# Patient Record
Sex: Male | Born: 1970
Health system: Southern US, Community
[De-identification: ages and names within clinical notes are randomized; demographics above are authoritative.]

## PROBLEM LIST (undated history)

## (undated) DIAGNOSIS — E669 Obesity, unspecified: Secondary | ICD-10-CM

## (undated) DIAGNOSIS — G4733 Obstructive sleep apnea (adult) (pediatric): Secondary | ICD-10-CM

## (undated) DIAGNOSIS — I10 Essential (primary) hypertension: Secondary | ICD-10-CM

## (undated) DIAGNOSIS — E119 Type 2 diabetes mellitus without complications: Secondary | ICD-10-CM

## (undated) DIAGNOSIS — E785 Hyperlipidemia, unspecified: Secondary | ICD-10-CM

---

## 2018-04-29 ENCOUNTER — Encounter (HOSPITAL_COMMUNITY): Payer: Self-pay

## 2018-04-29 ENCOUNTER — Emergency Department (HOSPITAL_COMMUNITY)
Admission: EM | Admit: 2018-04-29 | Discharge: 2018-04-29 | Disposition: A | Discharge status: Home / Self Care | Payer: Self-pay | Attending: Emergency Medicine | Admitting: Emergency Medicine

## 2018-04-29 ENCOUNTER — Other Ambulatory Visit: Payer: Self-pay

## 2018-04-29 DIAGNOSIS — R29898 Other symptoms and signs involving the musculoskeletal system: Secondary | ICD-10-CM

## 2018-04-29 DIAGNOSIS — R531 Weakness: Secondary | ICD-10-CM | POA: Insufficient documentation

## 2018-04-29 HISTORY — DX: Type 2 diabetes mellitus without complications: E11.9

## 2018-04-29 HISTORY — DX: Essential (primary) hypertension: I10

## 2018-04-29 LAB — CBC
HEMATOCRIT: 39.9 % (ref 39.0–52.0)
HEMOGLOBIN: 13.6 g/dL (ref 13.0–17.0)
MCH: 29.1 pg (ref 26.0–34.0)
MCHC: 34.1 g/dL (ref 30.0–36.0)
MCV: 85.4 fL (ref 78.0–100.0)
Platelets: 315 10*3/uL (ref 150–400)
RBC: 4.67 MIL/uL (ref 4.22–5.81)
RDW: 12.9 % (ref 11.5–15.5)
WBC: 5.9 10*3/uL (ref 4.0–10.5)

## 2018-04-29 LAB — COMPREHENSIVE METABOLIC PANEL
ALT: 14 U/L — AB (ref 17–63)
AST: 18 U/L (ref 15–41)
Albumin: 4.4 g/dL (ref 3.5–5.0)
Alkaline Phosphatase: 80 U/L (ref 38–126)
Anion gap: 8 (ref 5–15)
BILIRUBIN TOTAL: 0.8 mg/dL (ref 0.3–1.2)
BUN: 20 mg/dL (ref 6–20)
CALCIUM: 9.2 mg/dL (ref 8.9–10.3)
CO2: 23 mmol/L (ref 22–32)
Chloride: 106 mmol/L (ref 101–111)
Creatinine, Ser: 1.39 mg/dL — ABNORMAL HIGH (ref 0.61–1.24)
GFR calc Af Amer: >60 mL/min (ref >60)
GFR calc non Af Amer: 59 mL/min — ABNORMAL LOW (ref >60)
GLUCOSE: 106 mg/dL — AB (ref 65–99)
POTASSIUM: 3.7 mmol/L (ref 3.5–5.1)
Sodium: 137 mmol/L (ref 135–145)
Total Protein: 8.1 g/dL (ref 6.5–8.1)

## 2018-04-29 LAB — ABO/RH: ABO/RH(D): O POS

## 2018-04-29 LAB — CBG MONITORING, ED: Glucose-Capillary: 106 mg/dL — ABNORMAL HIGH (ref 65–99)

## 2018-04-29 LAB — TYPE AND SCREEN
ABO/RH(D): O POS
Antibody Screen: NEGATIVE

## 2018-04-29 NOTE — ED Notes (Signed)
No reply for VS at 15:26.

## 2018-04-29 NOTE — ED Notes (Signed)
MD at bedside giving discharge instructions.  Pt verbalized understanding.

## 2018-04-29 NOTE — ED Triage Notes (Signed)
Pt presents for evaluation of dizziness that started while he was in class today. Pt reports he has had some diarrhea and intermittent rectal bleeding since yesterday. Pt reports he is a diabetic, non insulin dependent and has not checked blood glucose but states vision disturbances intermittently.

## 2018-04-29 NOTE — ED Provider Notes (Signed)
[facility] EMERGENCY DEPARTMENT Provider Note   CSN: [id number] Arrival date & time: 04/29/18  1223     History   Chief Complaint Chief Complaint  Patient presents with  . Dizziness  . Rectal Bleeding    HPI Oscar Glenn is a 47 y.o. male.  47 yo M with a chief complaint of left leg weakness.  Patient is also had blood in his stool for 3 or 4 days that resolved about 2 or 3 days ago.  He denies back pain denies loss of bowel or bladder.  Denies neck pain.  Has had what he describes as a sinus headache.  Pain to the frontal sinus bilaterally.  Denies fevers or chills.  Denies any limitation to his back.  Feels that he has been walking with a limp due to the weakness.  Has never had this issue before.  Denies pain to the leg.  Denies trauma.  He is having some trouble controlling his blood sugars at home earlier this week as well.  The history is provided by the patient.  Dizziness  Associated symptoms: blood in stool and weakness (left leg)   Associated symptoms: no chest pain, no diarrhea, no headaches, no palpitations, no shortness of breath and no vomiting   Rectal Bleeding  Associated symptoms: dizziness   Associated symptoms: no abdominal pain, no fever and no vomiting   Illness  This is a new problem. The current episode started more than 1 week ago. The problem occurs constantly. The problem has not changed since onset.Pertinent negatives include no chest pain, no abdominal pain, no headaches and no shortness of breath. The symptoms are aggravated by walking. Nothing relieves the symptoms. He has tried nothing for the symptoms. The treatment provided no relief.    Past Medical History:  Diagnosis Date  . Diabetes mellitus without complication (HCC)   . Hypertension     There are no active problems to display for this patient.   History reviewed. No pertinent surgical history.      Home Medications    Prior to Admission medications   Not on  File    Family History No family history on file.  Social History Social History   Tobacco Use  . Smoking status: Never Smoker  . Smokeless tobacco: Never Used  Substance Use Topics  . Alcohol use: Not Currently  . Drug use: Not Currently     Allergies   Patient has no known allergies.   Review of Systems Review of Systems  Constitutional: Negative for chills and fever.  HENT: Negative for congestion and facial swelling.   Eyes: Negative for discharge and visual disturbance.  Respiratory: Negative for shortness of breath.   Cardiovascular: Negative for chest pain and palpitations.  Gastrointestinal: Positive for blood in stool and hematochezia. Negative for abdominal pain, diarrhea and vomiting.  Musculoskeletal: Positive for gait problem. Negative for arthralgias and myalgias.  Skin: Negative for color change and rash.  Neurological: Positive for dizziness and weakness (left leg). Negative for tremors, syncope and headaches.  Psychiatric/Behavioral: Negative for confusion and dysphoric mood.     Physical Exam Updated Vital Signs BP (!) 174/107   Pulse (!) 56   Temp 98.2 F (36.8 C) (Oral)   Resp 18   SpO2 100%   Physical Exam  Constitutional: He is oriented to person, place, and time. He appears well-developed and well-nourished.  HENT:  Head: Normocephalic and atraumatic.  Eyes: Pupils are equal, round, and reactive to light. EOM  are normal.  Neck: Normal range of motion. Neck supple. No JVD present.  Cardiovascular: Normal rate and regular rhythm. Exam reveals no gallop and no friction rub.  No murmur heard. Pulmonary/Chest: No respiratory distress. He has no wheezes.  Abdominal: He exhibits no distension. There is no rebound and no guarding.  Musculoskeletal: Normal range of motion.  Neurological: He is alert and oriented to person, place, and time. No cranial nerve deficit or sensory deficit. Coordination and gait normal. GCS eye subscore is 4. GCS verbal  subscore is 5. GCS motor subscore is 6. He displays no Babinski's sign on the right side. He displays no Babinski's sign on the left side.  Reflex Scores:      Tricep reflexes are 2+ on the right side and 2+ on the left side.      Bicep reflexes are 2+ on the right side and 2+ on the left side.      Brachioradialis reflexes are 2+ on the right side and 2+ on the left side.      Patellar reflexes are 2+ on the right side and 2+ on the left side.      Achilles reflexes are 2+ on the right side and 2+ on the left side. Left plantar flexion 4 out of 5 compared to right.  He is able to stand on his tiptoes.  He walks with no issue but he feels that his leg is lagging behind  Skin: No rash noted. No pallor.  Psychiatric: He has a normal mood and affect. His behavior is normal.  Nursing note and vitals reviewed.    ED Treatments / Results  Labs (all labs ordered are listed, but only abnormal results are displayed) Labs Reviewed  COMPREHENSIVE METABOLIC PANEL - Abnormal; Notable for the following components:      Result Value   Glucose, Bld 106 (*)    Creatinine, Ser 1.39 (*)    ALT 14 (*)    GFR calc non Af Amer 59 (*)    All other components within normal limits  CBG MONITORING, ED - Abnormal; Notable for the following components:   Glucose-Capillary 106 (*)    All other components within normal limits  CBC  POC OCCULT BLOOD, ED  TYPE AND SCREEN  ABO/RH    EKG EKG Interpretation  Date/Time:  Tuesday April 29 2018 12:30:02 EDT Ventricular Rate:  65 PR Interval:  170 QRS Duration: 76 QT Interval:  398 QTC Calculation: 413 R Axis:   21 Text Interpretation:  Normal sinus rhythm Nonspecific T wave abnormality Abnormal ECG No old tracing to compare Confirmed by Melene Plan [phone]) on 04/29/2018 5:10:19 PM   Radiology No results found.  Procedures Procedures (including critical care time)  Medications Ordered in ED Medications - No data to display   Initial Impression /  Assessment and Plan / ED Course  I have reviewed the triage vital signs and the nursing notes.  Pertinent labs & imaging results that were available during my care of the patient were reviewed by me and considered in my medical decision making (see chart for details).     47 yo M with a chief complaint of rectal bleeding that has resolved and transient dizziness and leg weakness.  He feels that the leg has been weak for about a week now.  On my exam the patient does have plantar flexion weakness compared to the other side.  Will discuss with neurology. Discussed with Dr. Amada Jupiter, with focal localized neuro finding, and  able to walk on his toes, did not feel that patient needed emergent MRI.  Recommended outpatient evaluation.    9:30 PM:  I have discussed the diagnosis/risks/treatment options with the patient and believe the pt to be eligible for discharge home to follow-up with PCP, neuro. We also discussed returning to the ED immediately if new or worsening sx occur. We discussed the sx which are most concerning (e.g., sudden worsening pain, fever, inability to tolerate by mouth) that necessitate immediate return. Medications administered to the patient during their visit and any new prescriptions provided to the patient are listed below.  Medications given during this visit Medications - No data to display  Labs reviewed cbc with hgb at baseline, cmp with mild worsening of creatinine   The patient appears reasonably screen and/or stabilized for discharge and I doubt any other medical condition or other [facility] requiring further screening, evaluation, or treatment in the ED at this time prior to discharge.    Final Clinical Impressions(s) / ED Diagnoses   Final diagnoses:  Weakness of left foot    ED Discharge Orders        Ordered    Ambulatory referral to Neurology    Comments:  Weakness with plantar flexion   04/29/18 1815       Melene Plan, DO 04/29/18 2130

## 2018-04-29 NOTE — Discharge Instructions (Signed)
Follow up with a neurologist.  Return for worsening symptoms

## 2018-04-30 ENCOUNTER — Encounter: Payer: Self-pay | Admitting: Neurology

## 2018-06-10 ENCOUNTER — Other Ambulatory Visit: Payer: Self-pay

## 2018-06-10 ENCOUNTER — Inpatient Hospital Stay (HOSPITAL_COMMUNITY): Payer: Self-pay

## 2018-06-10 ENCOUNTER — Emergency Department (HOSPITAL_COMMUNITY): Payer: Self-pay

## 2018-06-10 ENCOUNTER — Inpatient Hospital Stay (HOSPITAL_COMMUNITY)
Admission: EM | Admit: 2018-06-10 | Discharge: 2018-06-12 | DRG: 065 | Disposition: A | Discharge status: Home / Self Care | Payer: Self-pay | Attending: Family Medicine | Admitting: Family Medicine

## 2018-06-10 ENCOUNTER — Encounter (HOSPITAL_COMMUNITY): Payer: Self-pay

## 2018-06-10 DIAGNOSIS — Z590 Homelessness: Secondary | ICD-10-CM

## 2018-06-10 DIAGNOSIS — G4733 Obstructive sleep apnea (adult) (pediatric): Secondary | ICD-10-CM | POA: Diagnosis present

## 2018-06-10 DIAGNOSIS — I6523 Occlusion and stenosis of bilateral carotid arteries: Secondary | ICD-10-CM | POA: Diagnosis present

## 2018-06-10 DIAGNOSIS — Z87898 Personal history of other specified conditions: Secondary | ICD-10-CM | POA: Insufficient documentation

## 2018-06-10 DIAGNOSIS — G8194 Hemiplegia, unspecified affecting left nondominant side: Secondary | ICD-10-CM | POA: Diagnosis present

## 2018-06-10 DIAGNOSIS — I6609 Occlusion and stenosis of unspecified middle cerebral artery: Secondary | ICD-10-CM | POA: Diagnosis present

## 2018-06-10 DIAGNOSIS — R4701 Aphasia: Secondary | ICD-10-CM | POA: Diagnosis present

## 2018-06-10 DIAGNOSIS — I6623 Occlusion and stenosis of bilateral posterior cerebral arteries: Secondary | ICD-10-CM | POA: Diagnosis present

## 2018-06-10 DIAGNOSIS — Z9114 Patient's other noncompliance with medication regimen: Secondary | ICD-10-CM

## 2018-06-10 DIAGNOSIS — F329 Major depressive disorder, single episode, unspecified: Secondary | ICD-10-CM | POA: Diagnosis present

## 2018-06-10 DIAGNOSIS — I639 Cerebral infarction, unspecified: Secondary | ICD-10-CM

## 2018-06-10 DIAGNOSIS — R29702 NIHSS score 2: Secondary | ICD-10-CM | POA: Diagnosis present

## 2018-06-10 DIAGNOSIS — E876 Hypokalemia: Secondary | ICD-10-CM | POA: Diagnosis present

## 2018-06-10 DIAGNOSIS — E669 Obesity, unspecified: Secondary | ICD-10-CM | POA: Diagnosis present

## 2018-06-10 DIAGNOSIS — I6619 Occlusion and stenosis of unspecified anterior cerebral artery: Secondary | ICD-10-CM | POA: Diagnosis present

## 2018-06-10 DIAGNOSIS — I10 Essential (primary) hypertension: Secondary | ICD-10-CM | POA: Diagnosis present

## 2018-06-10 DIAGNOSIS — I160 Hypertensive urgency: Secondary | ICD-10-CM | POA: Diagnosis present

## 2018-06-10 DIAGNOSIS — Z609 Problem related to social environment, unspecified: Secondary | ICD-10-CM | POA: Diagnosis present

## 2018-06-10 DIAGNOSIS — I6381 Other cerebral infarction due to occlusion or stenosis of small artery: Principal | ICD-10-CM | POA: Diagnosis present

## 2018-06-10 DIAGNOSIS — E785 Hyperlipidemia, unspecified: Secondary | ICD-10-CM | POA: Diagnosis present

## 2018-06-10 DIAGNOSIS — R7303 Prediabetes: Secondary | ICD-10-CM

## 2018-06-10 DIAGNOSIS — R4781 Slurred speech: Secondary | ICD-10-CM | POA: Diagnosis present

## 2018-06-10 DIAGNOSIS — R471 Dysarthria and anarthria: Secondary | ICD-10-CM | POA: Diagnosis present

## 2018-06-10 DIAGNOSIS — F419 Anxiety disorder, unspecified: Secondary | ICD-10-CM | POA: Diagnosis present

## 2018-06-10 DIAGNOSIS — R402413 Glasgow coma scale score 13-15, at hospital admission: Secondary | ICD-10-CM | POA: Diagnosis present

## 2018-06-10 DIAGNOSIS — H538 Other visual disturbances: Secondary | ICD-10-CM | POA: Diagnosis present

## 2018-06-10 DIAGNOSIS — Z6832 Body mass index (BMI) 32.0-32.9, adult: Secondary | ICD-10-CM

## 2018-06-10 DIAGNOSIS — Z888 Allergy status to other drugs, medicaments and biological substances status: Secondary | ICD-10-CM

## 2018-06-10 HISTORY — DX: Obesity, unspecified: E66.9

## 2018-06-10 HISTORY — DX: Hyperlipidemia, unspecified: E78.5

## 2018-06-10 HISTORY — DX: Obstructive sleep apnea (adult) (pediatric): G47.33

## 2018-06-10 LAB — DIFFERENTIAL
Abs Immature Granulocytes: 0 10*3/uL (ref 0.0–0.1)
BASOS ABS: 0 10*3/uL (ref 0.0–0.1)
BASOS PCT: 0 %
EOS ABS: 0.1 10*3/uL (ref 0.0–0.7)
EOS PCT: 1 %
IMMATURE GRANULOCYTES: 0 %
Lymphocytes Relative: 32 %
Lymphs Abs: 2.2 10*3/uL (ref 0.7–4.0)
Monocytes Absolute: 0.6 10*3/uL (ref 0.1–1.0)
Monocytes Relative: 8 %
NEUTROS PCT: 59 %
Neutro Abs: 4 10*3/uL (ref 1.7–7.7)

## 2018-06-10 LAB — COMPREHENSIVE METABOLIC PANEL
ALBUMIN: 4.1 g/dL (ref 3.5–5.0)
ALT: 16 U/L — ABNORMAL LOW (ref 17–63)
ANION GAP: 10 (ref 5–15)
AST: 22 U/L (ref 15–41)
Alkaline Phosphatase: 63 U/L (ref 38–126)
BUN: 15 mg/dL (ref 6–20)
CO2: 24 mmol/L (ref 22–32)
Calcium: 9.1 mg/dL (ref 8.9–10.3)
Chloride: 105 mmol/L (ref 101–111)
Creatinine, Ser: 1.34 mg/dL — ABNORMAL HIGH (ref 0.61–1.24)
GFR calc Af Amer: >60 mL/min (ref >60)
GFR calc non Af Amer: >60 mL/min (ref >60)
GLUCOSE: 94 mg/dL (ref 65–99)
POTASSIUM: 3.9 mmol/L (ref 3.5–5.1)
SODIUM: 139 mmol/L (ref 135–145)
Total Bilirubin: 0.8 mg/dL (ref 0.3–1.2)
Total Protein: 7.2 g/dL (ref 6.5–8.1)

## 2018-06-10 LAB — CBC
HEMATOCRIT: 38.9 % — AB (ref 39.0–52.0)
Hemoglobin: 13.1 g/dL (ref 13.0–17.0)
MCH: 29.2 pg (ref 26.0–34.0)
MCHC: 33.7 g/dL (ref 30.0–36.0)
MCV: 86.6 fL (ref 78.0–100.0)
PLATELETS: 287 10*3/uL (ref 150–400)
RBC: 4.49 MIL/uL (ref 4.22–5.81)
RDW: 13 % (ref 11.5–15.5)
WBC: 6.8 10*3/uL (ref 4.0–10.5)

## 2018-06-10 LAB — CBG MONITORING, ED: GLUCOSE-CAPILLARY: 91 mg/dL (ref 65–99)

## 2018-06-10 LAB — I-STAT CHEM 8, ED
BUN: 17 mg/dL (ref 6–20)
CHLORIDE: 104 mmol/L (ref 101–111)
Calcium, Ion: 1.16 mmol/L (ref 1.15–1.40)
Creatinine, Ser: 1.2 mg/dL (ref 0.61–1.24)
Glucose, Bld: 95 mg/dL (ref 65–99)
HEMATOCRIT: 40 % (ref 39.0–52.0)
Hemoglobin: 13.6 g/dL (ref 13.0–17.0)
Potassium: 3.9 mmol/L (ref 3.5–5.1)
SODIUM: 139 mmol/L (ref 135–145)
TCO2: 24 mmol/L (ref 22–32)

## 2018-06-10 LAB — PROTIME-INR
INR: 0.98
Prothrombin Time: 12.9 seconds (ref 11.4–15.2)

## 2018-06-10 LAB — URINALYSIS, ROUTINE W REFLEX MICROSCOPIC
BILIRUBIN URINE: NEGATIVE
Glucose, UA: NEGATIVE mg/dL
Hgb urine dipstick: NEGATIVE
Ketones, ur: NEGATIVE mg/dL
Leukocytes, UA: NEGATIVE
NITRITE: NEGATIVE
PROTEIN: NEGATIVE mg/dL
Specific Gravity, Urine: 1.01 (ref 1.005–1.030)
pH: 7 (ref 5.0–8.0)

## 2018-06-10 LAB — I-STAT TROPONIN, ED: Troponin i, poc: 0.01 ng/mL (ref 0.00–0.08)

## 2018-06-10 LAB — APTT: APTT: 29 s (ref 24–36)

## 2018-06-10 LAB — ETHANOL

## 2018-06-10 MED ORDER — ACETAMINOPHEN 325 MG PO TABS: 650.0000 mg | ORAL_TABLET | ORAL | Status: DC | PRN

## 2018-06-10 MED ORDER — ENOXAPARIN SODIUM 40 MG/0.4ML ~~LOC~~ SOLN
40.0000 mg | SUBCUTANEOUS | Status: DC
  Administered 2018-06-11 (×2): 40 mg via SUBCUTANEOUS
  Filled 2018-06-10 (×2): qty 0.4

## 2018-06-10 MED ORDER — ROSUVASTATIN CALCIUM 20 MG PO TABS
20.0000 mg | ORAL_TABLET | Freq: Every day | ORAL | Status: DC
  Administered 2018-06-11: 20 mg via ORAL
  Filled 2018-06-10: qty 1

## 2018-06-10 MED ORDER — LISINOPRIL 20 MG PO TABS
20.0000 mg | ORAL_TABLET | Freq: Every day | ORAL | Status: DC
  Administered 2018-06-11: 20 mg via ORAL
  Filled 2018-06-10 (×2): qty 1

## 2018-06-10 MED ORDER — ENSURE ENLIVE PO LIQD
237.0000 mL | Freq: Two times a day (BID) | ORAL | Status: DC
  Administered 2018-06-11 – 2018-06-12 (×4): 237 mL via ORAL

## 2018-06-10 MED ORDER — HYDRALAZINE HCL 20 MG/ML IJ SOLN: 5.0000 mg | INTRAMUSCULAR | Status: DC | PRN

## 2018-06-10 MED ORDER — ACETAMINOPHEN 650 MG RE SUPP: 650.0000 mg | RECTAL | Status: DC | PRN

## 2018-06-10 MED ORDER — LABETALOL HCL 5 MG/ML IV SOLN
20.0000 mg | INTRAVENOUS | Status: DC | PRN
  Administered 2018-06-10: 20 mg via INTRAVENOUS
  Filled 2018-06-10: qty 4

## 2018-06-10 MED ORDER — ASPIRIN 81 MG PO CHEW
81.0000 mg | CHEWABLE_TABLET | Freq: Every day | ORAL | Status: DC
  Administered 2018-06-11 (×2): 81 mg via ORAL
  Filled 2018-06-10 (×2): qty 1

## 2018-06-10 MED ORDER — ACETAMINOPHEN 160 MG/5ML PO SOLN: 650.0000 mg | ORAL | Status: DC | PRN

## 2018-06-10 MED ORDER — SENNOSIDES-DOCUSATE SODIUM 8.6-50 MG PO TABS: 1.0000 | ORAL_TABLET | Freq: Every evening | ORAL | Status: DC | PRN

## 2018-06-10 MED ORDER — STROKE: EARLY STAGES OF RECOVERY BOOK
Freq: Once | Status: AC
  Administered 2018-06-11

## 2018-06-10 MED ORDER — CLOPIDOGREL BISULFATE 75 MG PO TABS
75.0000 mg | ORAL_TABLET | Freq: Every day | ORAL | Status: DC
  Administered 2018-06-11 – 2018-06-12 (×3): 75 mg via ORAL
  Filled 2018-06-10 (×3): qty 1

## 2018-06-10 NOTE — ED Triage Notes (Addendum)
Pt endorses slurred speech, blurred vision, jittery, dizziness that has been intermittent over the last 2 months. Pt endorses that he has had difficulty with his left side for 3 months. "I have been walking with a limp and dragging my left foot"  Pt noted to still have some slurring of speech and some blurred vison. Hypertensive, hx of same and not taking meds. Also not taking DM meds. No drift, facial droop or weakness noted at this time.

## 2018-06-10 NOTE — ED Provider Notes (Signed)
[facility] Provider [facility]   CSN: [id number] Arrival date & time: 06/10/18  1124     History   Chief Complaint Chief Complaint  Patient presents with  . Blurred Vision  . Headache  . Aphasia    HPI Oscar Glenn is a 47 y.o. male.  HPI   He presents for evaluation of dragging his left leg for 2 months, associated with occasional tingling and headache.  Today he noticed tingling again in his left arm, and a headache, so decided to come here.  Yesterday he was doing fairly well.  He states he last took hydrochlorothiazide for hypertension, about 2 months ago.  [facility] the shelter.  He denies fever, chills, nausea, vomiting, cough, shortness of breath, abdominal or back pain.  He denies use of tobacco, alcohol or drugs.  There are no other no modifying factors.  Past Medical History:  Diagnosis Date  . Diabetes mellitus without complication (HCC)   . HLD (hyperlipidemia)   . Hypertension   . Obesity   . OSA (obstructive sleep apnea)     Patient Active Problem List   Diagnosis Date Noted  . Stroke (HCC) 06/10/2018  . History of prediabetes   . Essential hypertension   . Hyperlipidemia   . OSA (obstructive sleep apnea)     History reviewed. No pertinent surgical history.      Home Medications    Prior to Admission medications   Not on File    Family History Family History  Adopted: Yes  Family history unknown: Yes    Social History Social History   Tobacco Use  . Smoking status: Never Smoker  . Smokeless tobacco: Never Used  Substance Use Topics  . Alcohol use: Not Currently  . Drug use: Not Currently     Allergies   Patient has no known allergies.   Review of Systems Review of Systems  All other systems reviewed and are negative.    Physical Exam Updated Vital Signs BP (!) 147/86 (BP Location: Left Arm)   Pulse 61   Temp 98.5 F (36.9 C) (Oral)   Resp 16   Ht 5\' 8"  (1.727 m)    Wt 97.5 kg (215 lb)   SpO2 98%   BMI 32.69 kg/m   Physical Exam  Constitutional: He is oriented to person, place, and time. He appears well-developed and well-nourished.  HENT:  Head: Normocephalic and atraumatic.  Right Ear: External ear normal.  Left Ear: External ear normal.  Eyes: Pupils are equal, round, and reactive to light. Conjunctivae and EOM are normal.  Neck: Normal range of motion and phonation normal. Neck supple.  Cardiovascular: Normal rate, regular rhythm and normal heart sounds.  Pulmonary/Chest: Effort normal and breath sounds normal. He exhibits no bony tenderness.  Abdominal: Soft. There is no tenderness.  Musculoskeletal: Normal range of motion.  Neurological: He is alert and oriented to person, place, and time. No cranial nerve deficit. He exhibits normal muscle tone. Coordination normal.  No dysarthria, aphasia or nystagmus.  He reports decreased light touch sensation in the left hand as compared to the right.  Skin: Skin is warm, dry and intact.  Psychiatric: He has a normal mood and affect. His behavior is normal. Judgment and thought content normal.  Nursing note and vitals reviewed.    ED Treatments / Results  Labs (all labs ordered are listed, but only abnormal results are displayed) Labs Reviewed  CBC - Abnormal; Notable for the  following components:      Result Value   HCT 38.9 (*)    All other components within normal limits  COMPREHENSIVE METABOLIC PANEL - Abnormal; Notable for the following components:   Creatinine, Ser 1.34 (*)    ALT 16 (*)    All other components within normal limits  URINALYSIS, ROUTINE W REFLEX MICROSCOPIC - Abnormal; Notable for the following components:   Color, Urine STRAW (*)    All other components within normal limits  LIPID PANEL - Abnormal; Notable for the following components:   Cholesterol 233 (*)    Triglycerides 199 (*)    HDL 39 (*)    LDL Cholesterol 154 (*)    All other components within normal limits    CBC - Abnormal; Notable for the following components:   Hemoglobin 12.7 (*)    HCT 37.8 (*)    All other components within normal limits  BASIC METABOLIC PANEL - Abnormal; Notable for the following components:   Potassium 3.2 (*)    Creatinine, Ser 1.38 (*)    Calcium 8.5 (*)    GFR calc non Af Amer 59 (*)    All other components within normal limits  MRSA PCR SCREENING  PROTIME-INR  APTT  DIFFERENTIAL  ETHANOL  RAPID URINE DRUG SCREEN, HOSP PERFORMED  HEMOGLOBIN A1C  HIV ANTIBODY (ROUTINE TESTING)  MAGNESIUM  CBG MONITORING, ED  I-STAT TROPONIN, ED  CBG MONITORING, ED  I-STAT CHEM 8, ED    EKG EKG Interpretation  Date/Time:  Tuesday June 10 2018 11:58:44 EDT Ventricular Rate:  66 PR Interval:  174 QRS Duration: 76 QT Interval:  410 QTC Calculation: 429 R Axis:   14 Text Interpretation:  Normal sinus rhythm Nonspecific T wave abnormality Abnormal ECG since last tracing no significant change Confirmed by Mancel Bale [phone]) on 06/10/2018 3:10:49 PM   Radiology Dg Chest 2 View  Result Date: 06/11/2018 CLINICAL DATA:  Slurred speech and blurry vision. EXAM: CHEST - 2 VIEW COMPARISON:  None. FINDINGS: Top-normal size heart. No aortic aneurysm. Clear lungs. No acute osseous abnormality. IMPRESSION: No active cardiopulmonary disease. Electronically Signed   By: Tollie Eth M.D.   On: 06/11/2018 00:25   Ct Head Wo Contrast  Result Date: 06/10/2018 CLINICAL DATA:  Blurred vision with dizziness and slurred speech EXAM: CT HEAD WITHOUT CONTRAST TECHNIQUE: Contiguous axial images were obtained from the base of the skull through the vertex without intravenous contrast. COMPARISON:  None. FINDINGS: Brain: The ventricles are normal in size and configuration. There is a cavum septum pellucidum, an anatomic variant. There is no intracranial mass, hemorrhage, extra-axial fluid collection, or midline shift. There is a prior small infarct in the anterior right lentiform nucleus. There  is small vessel disease with several small lacunar infarcts in the centra semiovale bilaterally. There are small lacunar type infarcts in the left thalamus. There is a prior lacunar infarct in the left mid pons region anteriorly. There is decreased attenuation in the region of the anterior limb of the left internal capsule which is somewhat ill-defined and may represent a recent/acute infarct. Vascular: No hyperdense vessel evident. There is mild calcification in each cavernous carotid artery region. Skull: Bony calvarium appears intact. Sinuses/Orbits: There is mucosal thickening in several ethmoid air cells bilaterally. Other visualized paranasal sinuses are clear. There is leftward deviation of the anterior nasal septum. Orbits appear symmetric bilaterally. Other: Mastoid air cells are clear. IMPRESSION: Atrophy with supratentorial small vessel disease. Scattered small infarcts are noted at several sites.  There is ill-defined decreased attenuation in the anterior limb of the left internal capsule. Question recent and possibly acute infarct in this area. Other apparent infarcts are felt to most likely be older based on their overall appearance. No hemorrhage or mass. There are foci of arterial vascular calcification in each distal carotid artery. There is mucosal thickening in several ethmoid air cells. There is leftward deviation of the anterior nasal septum. Electronically Signed   By: Bretta Bang III M.D.   On: 06/10/2018 13:26   Mr Brain Wo Contrast  Result Date: 06/10/2018 CLINICAL DATA:  Slurred speech, blurry vision, dizziness intermittent for 2 months. History of hypertension, hyperlipidemia and diabetes. EXAM: MRI HEAD WITHOUT CONTRAST TECHNIQUE: Multiplanar, multiecho pulse sequences of the brain and surrounding structures were obtained without intravenous contrast. COMPARISON:  CT HEAD June 10, 2018 FINDINGS: Moderately motion degraded examination, patient is sleepy and unable to hold still.  INTRACRANIAL CONTENTS: Subcentimeter reduced diffusion RIGHT parasagittal ventral pons, difficult to characterize on ADC maps. Old LEFT pontine lacunar infarct. Old bilateral basal ganglia and LEFT greater than RIGHT thalamus infarcts. Susceptibility artifact RIGHT basal ganglia associated with old infarct. Scattered chronic microhemorrhages associated with chronic hypertension. Patchy to confluent supratentorial white matter FLAIR T2 hyperintensities, some of which demonstrate cystic component. No parenchymal brain volume loss for age. Cavum septum pellucidum et vergae, normal variant. No midline shift, mass effect mass effect or masses. 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 SKULL AND UPPER CERVICAL SPINE: No abnormal sellar expansion. No suspicious calvarial bone marrow signal. Craniocervical junction maintained. SINUSES/ORBITS: The mastoid air-cells and included paranasal sinuses are well-aerated.The included ocular globes and orbital contents are non-suspicious. OTHER: None. IMPRESSION: 1. Motion degraded examination. 2. Acute versus subacute subcentimeter pontine small vessel infarct. 3. 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 4. Moderate chronic small vessel ischemic changes. Possible component of chronic demyelination though, unlikely. Electronically Signed   By: Awilda Metro M.D.   On: 06/10/2018 19:08    Procedures .Critical Care Performed by: Mancel Bale, MD Authorized by: Mancel Bale, MD   Critical care provider statement:    Critical care time (minutes):  35   Critical care start time:  06/10/2018 4:11 PM   Critical care end time:  06/10/2018 5:15 PM   Critical care time was exclusive of:  Separately billable procedures and treating other patients   Critical care was time spent personally by me on the following activities:  Blood draw for specimens, development of treatment plan  with patient or surrogate, discussions with consultants, evaluation of patient's response to treatment, examination of patient, obtaining history from patient or surrogate, ordering and performing treatments and interventions, ordering and review of laboratory studies, pulse oximetry, re-evaluation of patient's condition, review of old charts and ordering and review of radiographic studies   (including critical care time)  Medications Ordered in ED Medications  acetaminophen (TYLENOL) tablet 650 mg (has no administration in time range)    Or  acetaminophen (TYLENOL) solution 650 mg (has no administration in time range)    Or  acetaminophen (TYLENOL) suppository 650 mg (has no administration in time range)  senna-docusate (Senokot-S) tablet 1 tablet (has no administration in time range)  enoxaparin (LOVENOX) injection 40 mg (40 mg Subcutaneous Given 06/11/18 0012)  rosuvastatin (CRESTOR) tablet 20 mg (has no administration in time range)  lisinopril (PRINIVIL,ZESTRIL) tablet 20 mg (20 mg Oral Not Given 06/11/18 1043)  aspirin chewable tablet 81 mg (81  mg Oral Given 06/11/18 1044)  clopidogrel (PLAVIX) tablet 75 mg (75 mg Oral Given 06/11/18 1043)  hydrALAZINE (APRESOLINE) injection 5 mg (has no administration in time range)  feeding supplement (ENSURE ENLIVE) (ENSURE ENLIVE) liquid 237 mL (237 mLs Oral Given 06/11/18 1044)  potassium chloride SA (K-DUR,KLOR-CON) CR tablet 40 mEq (40 mEq Oral Given 06/11/18 1044)   stroke: mapping our early stages of recovery book ( Does not apply Given 06/11/18 0012)     Initial Impression / Assessment and Plan / ED Course  I have reviewed the triage vital signs and the nursing notes.  Pertinent labs & imaging results that were available during my care of the patient were reviewed by me and considered in my medical decision making (see chart for details).  Clinical Course as of Jun 12 1043  Wed Jun 11, 2018  1040 Normal  Urine rapid drug screen (hosp  performed) [EW]  1040 Normal  Protime-INR [EW]  1040 Normal  CBC(!) [EW]  1040 Normal  Urinalysis, Routine w reflex microscopic(!) [EW]  1040 Normal  I-stat troponin, ED [EW]  1043 Possible left internal capsule infarct, not consistent with clinical findings.  No evidence for intracranial bleeding.  CT HEAD WO CONTRAST [EW]    Clinical Course User Index [EW] Mancel Bale, MD     Patient Vitals for the past 24 hrs:  BP Temp Temp src Pulse Resp SpO2 Height Weight  06/11/18 0732 (!) 147/86 98.5 F (36.9 C) Oral 61 16 98 % - -  06/11/18 0420 136/79 97.9 F (36.6 C) Oral (!) 57 16 96 % - -  06/11/18 0004 (!) 161/100 98.1 F (36.7 C) Oral (!) 55 18 100 % - -  06/10/18 2314 (!) 143/86 98.1 F (36.7 C) Oral (!) 52 18 99 % - -  06/10/18 2129 (!) 155/97 97.8 F (36.6 C) Oral (!) 56 18 99 % - -  06/10/18 1932 (!) 186/123 97.8 F (36.6 C) Oral (!) 58 18 100 % - -  06/10/18 1730 (!) 179/119 - - (!) 57 18 100 % - -  06/10/18 1715 (!) 163/122 - - (!) 56 (!) 23 98 % - -  06/10/18 1700 (!) 196/113 - - (!) 53 (!) 24 100 % - -  06/10/18 1645 (!) 189/121 - - 60 16 100 % - -  06/10/18 1635 - 98 F (36.7 C) - - - - - -  06/10/18 1630 (!) 192/123 - - 63 (!) 25 100 % - -  06/10/18 1615 (!) 170/116 - - 64 20 100 % - -  06/10/18 1457 (!) 184/114 97.7 F (36.5 C) Oral 60 20 100 % - -  06/10/18 1355 (!) 187/117 98.2 F (36.8 C) Oral 60 20 99 % - -  06/10/18 1310 (!) 173/117 - - 61 18 100 % - -  06/10/18 1146 (!) 199/130 98.3 F (36.8 C) Oral 64 16 100 % 5\' 8"  (1.727 m) 97.5 kg (215 lb)   16: 45-brief discussion with neuro hospitalist who agrees to see patient as a Research scientist (medical), and will call back later to obtain details of this case.  4:55 PM-Consult complete with admitting resident. Patient case explained and discussed.  She agrees to admit patient for further evaluation and treatment. Call ended at 1715    Medical Decision Making: Nonspecific symptoms, ongoing and chronic.  Abnormal CT  head, consistent with acute CVA, not clearly associated with clinical findings.  Advanced imaging with MRI ordered.  Acute hypertension, untreated controlled with  IV antihypertensive in the ED.  Arrangements made for admission.  CRITICAL CARE-yes Performed by: Mancel Bale   Nursing Notes Reviewed/ Care Coordinated Applicable Imaging Reviewed Interpretation of Laboratory Data incorporated into ED treatment   Contacted primary care admitting service to admit as unassigned patient.    Final Clinical Impressions(s) / ED Diagnoses   Final diagnoses:  Accelerated hypertension    ED Discharge Orders    None       Mancel Bale, MD 06/11/18 1049

## 2018-06-10 NOTE — ED Notes (Signed)
Attempted report 

## 2018-06-10 NOTE — H&P (Signed)
Family Medicine [facility] History and Physical Service Pager: [phone]  Patient name: Oscar Glenn Medical record number: [id number] Date of birth: 07-Sep-1971 Age: 47 y.o. Gender: male  Primary Care Provider: Patient, No Pcp Per Consultants: neurology curbside Code Status: Full   Chief Complaint: L sided weakness, slurred speech, blurred vision   Assessment and Plan: Oscar Glenn is a 47 y.o. male presenting with L sided weakness, slurred speech, blurred vision. PMH is significant for prediabetes, HTN, HLD, OSA, obesity.  L sided weakness and numbness with blurred vision. Possibly due to CVA however uncertain if these symptoms are acute or subacute given timeline with onset 2-3 months ago vs worsening this morning. CT head does show multiple scattered small infarcts that appears older in age however does have an area of decreased attenuation at anterior limb of L internal capsule that could represent an acute infarct. On exam does have some decreased sensation on L side as well as differences in finger-to-nose and heel-to-shin on L side which does not correlate with CT findings. Patient does have multiple risk factors given h/o prediabetes, HTN, HLD, obesity and has been off all medications. DDx also includes hypertensive urgency as patient BP up to 199/130 on presentation, now s/p 20mg  IV labetalol in the ED.  - admit to telemetry, attending Dr. Leveda AnnaHensel - Neurology curbside, based on CT findings unlikely to be acute stroke. Recommended MRI brain and will consult if there is an acute R sided infarct.  - MRI brain pending - permissive HTN pending MRI  - risk stratification labs: a1c, LDL - UDS, EtOH level pending - PT/OT/SLP - carotid dopplers - echo - start rosuvastatin 20mg  qd  Elevated BP in the setting of medication noncompliance for HTN. BP 199/130 on presentation, now s/p 20mg  IV labetalol in the ED. Patient does not have chest pain, trop poc 0.01 and EKG  unchanged from prior in chart. - permissive HTN pending MRI - IV hydralazine 5mg  q4prn for SBP > 220 or DBP >120 - monitor BPs - am EKG   H/o prediabetes. Last a1c 6.4 on 04/12/16. Glucose on admit 94 on BMP.  - a1c pending - monitor glucose on BMPs  HLD - restart statin as per above  H/o OSA. Patient reports undergoing sleep study several years ago but never being placed on CPAP - follow up as outpatient, CM consulted for PCP placement  Homelessness. Lives at shelter - Social work consulted   FEN/GI: NPO until passes stroke swallow screen Prophylaxis: lovenox  Disposition: admit to inpatient for neuro work up  History of Present Illness:  Oscar Glenn is a 47 y.o. male presenting with L sided weakness and slurred speech. States that had has L sided weakness for the past 2-3 month, that one day the people he was around noticed that he was stumbling with L foot droop. He states that he did noticed that his L side felt somewhat numb and weakness but it felt more intermittent to him so he did not seek any medical attention. Then had some worsening of his symptoms today when he woke up and noticed that didn't feel like normal self and then gradually noticed that speech sounded slurred and he was drooling today and that his L sided weakness was a little bit worse than usual. He did have an associated severe headache and blurry vision that have since improved. No chest pain or palpitations. He thought that this was related to his BP as he has been out of his home  [facility] for the past 2-3 months. He currently lives in a shelter and has not seen a doctor in a few years so has been off metformin and simvastatin for the last couple years.   Review Of Systems: Per HPI with the following additions:   Review of Systems  Constitutional: Negative for chills and fever.  Eyes: Positive for blurred vision. Negative for photophobia and pain.  Respiratory: Negative for shortness of breath.   Cardiovascular:  Negative for palpitations and leg swelling.  Gastrointestinal: Negative for abdominal pain, constipation, diarrhea, heartburn, nausea and vomiting.  Genitourinary: Negative for dysuria and urgency.  Neurological: Positive for sensory change, speech change, focal weakness and headaches. Negative for seizures.    There are no active problems to display for this patient.   Past Medical History: Past Medical History:  Diagnosis Date  . Diabetes mellitus without complication (HCC)   . HLD (hyperlipidemia)   . Hypertension   . Obesity   . OSA (obstructive sleep apnea)     Past Surgical History: History reviewed. No pertinent surgical history.  Social History: Social History   Tobacco Use  . Smoking status: Never Smoker  . Smokeless tobacco: Never Used  Substance Use Topics  . Alcohol use: Not Currently  . Drug use: Not Currently   Additional social history:  Lives at shelter, is homeless. No EtOH in > 2 years. Never smoker or used any other tobacco products. Remote history of marijuana use, no other recreational drug use ever.  Please also refer to relevant sections of EMR.  Family History: History reviewed. No pertinent family history. Unknown due to being adopted   Allergies and Medications: No Known Allergies No current facility-administered medications on file prior to encounter.    No current outpatient medications on file prior to encounter.    Objective: BP (!) 184/114 (BP Location: Right Arm)   Pulse 60   Temp 98 F (36.7 C)   Resp 20   Ht 5\' 8"  (1.727 m)   Wt 97.5 kg (215 lb)   SpO2 100%   BMI 32.69 kg/m  Exam: General: laying in bed, in NAD Eyes:  PERRL, EOMI, no nystagmus ENTM: No facial droop. Uvula midline. Tongue midline. MMM  Neck: supple, normal ROM  Cardiovascular: RRR, normal s1 and s2, no murmurs Respiratory: CTAB, normal effort on room air.  Gastrointestinal: soft, nontender, + bowel sounds, obese  MSK: normal tone. No LE edema. Moving all  limbs equally.  Derm: warm and dry. No rashes Neuro: alert and awake, oriented x3. Decreased sensation on L side of face, UE and LE. Strength 5/5 throughout. DTR 2+ in UE and LE. Delayed finger-to-nose and heel-to-shin on L side.  Psych: appropriate affect  Labs and Imaging: CBC BMET  Recent Labs  Lab 06/10/18 1204 06/10/18 1214  WBC 6.8  --   HGB 13.1 13.6  HCT 38.9* 40.0  PLT 287  --    Recent Labs  Lab 06/10/18 1204 06/10/18 1214  NA 139 139  K 3.9 3.9  CL 105 104  CO2 24  --   BUN 15 17  CREATININE 1.34* 1.20  GLUCOSE 94 95  CALCIUM 9.1  --      Troponin (Point of Care Test) Recent Labs    06/10/18 1213  TROPIPOC 0.01     Ct Head Wo Contrast  Result Date: 06/10/2018 CLINICAL DATA:  Blurred vision with dizziness and slurred speech EXAM: CT HEAD WITHOUT CONTRAST TECHNIQUE: Contiguous axial images were obtained from the base  of the skull through the vertex without intravenous contrast. COMPARISON:  None. FINDINGS: Brain: The ventricles are normal in size and configuration. There is a cavum septum pellucidum, an anatomic variant. There is no intracranial mass, hemorrhage, extra-axial fluid collection, or midline shift. There is a prior small infarct in the anterior right lentiform nucleus. There is small vessel disease with several small lacunar infarcts in the centra semiovale bilaterally. There are small lacunar type infarcts in the left thalamus. There is a prior lacunar infarct in the left mid pons region anteriorly. There is decreased attenuation in the region of the anterior limb of the left internal capsule which is somewhat ill-defined and may represent a recent/acute infarct. Vascular: No hyperdense vessel evident. There is mild calcification in each cavernous carotid artery region. Skull: Bony calvarium appears intact. Sinuses/Orbits: There is mucosal thickening in several ethmoid air cells bilaterally. Other visualized paranasal sinuses are clear. There is leftward  deviation of the anterior nasal septum. Orbits appear symmetric bilaterally. Other: Mastoid air cells are clear. IMPRESSION: Atrophy with supratentorial small vessel disease. Scattered small infarcts are noted at several sites. There is ill-defined decreased attenuation in the anterior limb of the left internal capsule. Question recent and possibly acute infarct in this area. Other apparent infarcts are felt to most likely be older based on their overall appearance. No hemorrhage or mass. There are foci of arterial vascular calcification in each distal carotid artery. There is mucosal thickening in several ethmoid air cells. There is leftward deviation of the anterior nasal septum. Electronically Signed   By: Bretta Bang III M.D.   On: 06/10/2018 13:26    Leland Her, DO 06/10/2018, 5:13 PM PGY-2, [facility] FPTS Intern pager: [phone], text pages welcome

## 2018-06-11 ENCOUNTER — Inpatient Hospital Stay (HOSPITAL_COMMUNITY): Payer: Self-pay

## 2018-06-11 ENCOUNTER — Encounter (HOSPITAL_COMMUNITY): Payer: Self-pay | Admitting: Radiology

## 2018-06-11 DIAGNOSIS — I6789 Other cerebrovascular disease: Secondary | ICD-10-CM

## 2018-06-11 DIAGNOSIS — I639 Cerebral infarction, unspecified: Secondary | ICD-10-CM

## 2018-06-11 LAB — LIPID PANEL
CHOL/HDL RATIO: 6 ratio
Cholesterol: 233 mg/dL — ABNORMAL HIGH (ref 0–200)
HDL: 39 mg/dL — ABNORMAL LOW (ref >40)
LDL CALC: 154 mg/dL — AB (ref 0–99)
TRIGLYCERIDES: 199 mg/dL — AB (ref <150)
VLDL: 40 mg/dL (ref 0–40)

## 2018-06-11 LAB — BASIC METABOLIC PANEL
Anion gap: 6 (ref 5–15)
BUN: 17 mg/dL (ref 6–20)
CALCIUM: 8.5 mg/dL — AB (ref 8.9–10.3)
CO2: 26 mmol/L (ref 22–32)
CREATININE: 1.38 mg/dL — AB (ref 0.61–1.24)
Chloride: 105 mmol/L (ref 101–111)
GFR calc Af Amer: >60 mL/min (ref >60)
GFR calc non Af Amer: 59 mL/min — ABNORMAL LOW (ref >60)
Glucose, Bld: 89 mg/dL (ref 65–99)
Potassium: 3.2 mmol/L — ABNORMAL LOW (ref 3.5–5.1)
Sodium: 137 mmol/L (ref 135–145)

## 2018-06-11 LAB — CBC
HEMATOCRIT: 37.8 % — AB (ref 39.0–52.0)
Hemoglobin: 12.7 g/dL — ABNORMAL LOW (ref 13.0–17.0)
MCH: 28.8 pg (ref 26.0–34.0)
MCHC: 33.6 g/dL (ref 30.0–36.0)
MCV: 85.7 fL (ref 78.0–100.0)
Platelets: 289 10*3/uL (ref 150–400)
RBC: 4.41 MIL/uL (ref 4.22–5.81)
RDW: 12.9 % (ref 11.5–15.5)
WBC: 6 10*3/uL (ref 4.0–10.5)

## 2018-06-11 LAB — MAGNESIUM: Magnesium: 2 mg/dL (ref 1.7–2.4)

## 2018-06-11 LAB — RAPID URINE DRUG SCREEN, HOSP PERFORMED
AMPHETAMINES: NOT DETECTED
BENZODIAZEPINES: NOT DETECTED
Barbiturates: NOT DETECTED
COCAINE: NOT DETECTED
OPIATES: NOT DETECTED
Tetrahydrocannabinol: NOT DETECTED

## 2018-06-11 LAB — MRSA PCR SCREENING: MRSA by PCR: NEGATIVE

## 2018-06-11 LAB — HEMOGLOBIN A1C
Hgb A1c MFr Bld: 5.5 % (ref 4.8–5.6)
MEAN PLASMA GLUCOSE: 111.15 mg/dL

## 2018-06-11 LAB — HIV ANTIBODY (ROUTINE TESTING W REFLEX): HIV Screen 4th Generation wRfx: NONREACTIVE

## 2018-06-11 LAB — ECHOCARDIOGRAM COMPLETE
HEIGHTINCHES: 68 in
Weight: 3440 oz

## 2018-06-11 MED ORDER — POTASSIUM CHLORIDE CRYS ER 20 MEQ PO TBCR
40.0000 meq | EXTENDED_RELEASE_TABLET | Freq: Two times a day (BID) | ORAL | Status: AC
  Administered 2018-06-11 (×2): 40 meq via ORAL
  Filled 2018-06-11 (×2): qty 2

## 2018-06-11 MED ORDER — IOPAMIDOL (ISOVUE-370) INJECTION 76%
INTRAVENOUS | Status: AC
  Administered 2018-06-11: 18:00:00
  Filled 2018-06-11: qty 50

## 2018-06-11 MED ORDER — IOPAMIDOL (ISOVUE-370) INJECTION 76%
50.0000 mL | Freq: Once | INTRAVENOUS | Status: AC | PRN
  Administered 2018-06-11: 50 mL via INTRAVENOUS

## 2018-06-11 MED ORDER — ASPIRIN 81 MG PO CHEW
324.0000 mg | CHEWABLE_TABLET | Freq: Every day | ORAL | Status: DC
  Administered 2018-06-12: 324 mg via ORAL
  Filled 2018-06-11: qty 4

## 2018-06-11 NOTE — Progress Notes (Signed)
Initial Nutrition Assessment  DOCUMENTATION CODES:    Not applicable  INTERVENTION:  Continue Ensure Enlive po BID, each supplement provides 350 kcal and 20 grams of protein  NUTRITION DIAGNOSIS:   Increased nutrient needs related to acute illness as evidenced by estimated needs.  GOAL:   Patient will meet greater than or equal to 90% of their needs  MONITOR:   PO intake, Supplement acceptance, Skin, Weight trends, Labs, I & O's  REASON FOR ASSESSMENT:   Malnutrition Screening Tool    ASSESSMENT:   47 y.o. M admitted on 06/10/18 for stroke. Pt is homeless living at a shelter; pt has not been tot the doctor recently and has not been taking metformin. PMH of T2DM, HTN, HLD, OSA.   Per pt report, lost 40 lbs in 1 month (16% in 1 month - significant); UBW typically 225-230 lbs per pt, got up to 265-275 lbs, now 215 lbs. No weight hx in chart, unable to confirm weight loss. When asked how long it took to lose that weight pt laughed and responded "It seems like it just fell off, my clothes were so big"; pt reports about a month. Pt reports not knowing why he lost this much weight; he hasn't changed his eating or activity habits. Pt reports only holding fluid within the last couple of days.  Pt reports eating lunch and dinner (sometimes breakfast). Pt reports a typical day consists of eggs in the morning, sandwich + salad or soup for lunch, and a meat/vegetable/starch for dinner; pt reports each meal is definitely more than 2 fistfuls worth of food. Pt reports no decrease in appetite. Recommended ways for pt to optimize nutrition by eating protein foods first, adding calories to foods he is already eating, and adding supplementation like ensure - provided some coupons.   Per pt report ate 100% of breakfast. Pt opened ensure during visit, reports liking it well enough to drink it.   Unsure if pt actually lost that amount of weight; if pt truly lost that weight within that timeframe, no  obvious reason for the weight loss given pt report. Suspect pt had lost some weight given mild depletions. Pt at risk for malnutrition.   Medications reviewed: plavix, EE BID, K+Cl, crestor.   Labs reviewed: creatinine 1.38 (H), GFR 59 (L), hemoglobin 12.7 (L), HCT 37.8 (L).   NUTRITION - FOCUSED PHYSICAL EXAM:    Most Recent Value  Orbital Region  No depletion  Upper Arm Region  Mild depletion  Thoracic and Lumbar Region  No depletion  Buccal Region  No depletion  Temple Region  No depletion  Clavicle Bone Region  No depletion  Clavicle and Acromion Bone Region  No depletion  Scapular Bone Region  Mild depletion  Dorsal Hand  Mild depletion  Patellar Region  No depletion  Anterior Thigh Region  Mild depletion  Posterior Calf Region  Mild depletion  Edema (RD Assessment)  Mild  Hair  Reviewed  Eyes  Reviewed  Mouth  Reviewed  Skin  Reviewed  Nails  Reviewed       Diet Order:   Diet Order           Diet Heart Room service appropriate? Yes; Fluid consistency: Thin  Diet effective now          EDUCATION NEEDS:   Education needs have been addressed  Skin:  Skin Assessment: Reviewed RN Assessment  Last BM:  06/11/18  Height:   Ht Readings from Last 1 Encounters:  06/10/18 5'  8" (1.727 m)    Weight:   Wt Readings from Last 1 Encounters:  06/10/18 215 lb (97.5 kg)    Ideal Body Weight:  70 kg  BMI:  Body mass index is 32.69 kg/m.  Estimated Nutritional Needs:   Kcal:  2000-2200 kcal  Protein:  90-105 g   Fluid:  >/= 2L or per MD    Sherrine Maples, Dietetic Intern

## 2018-06-11 NOTE — Progress Notes (Signed)
  Echocardiogram 2D Echocardiogram has been performed.  Oscar Glenn, Oscar Glenn 06/11/2018, 4:12 PM

## 2018-06-11 NOTE — Consult Note (Addendum)
NEURO HOSPITALIST      Requesting Physician: Dr. Leveda Anna    Chief Complaint:  Slurred speech/ left arm weakness/ left leg weakness  History obtained from:  Patient  ( poor historian)  HPI:                                                                                                                                         Oscar Glenn is an 47 y.o. male  With PMH significant for HTN ( non-compliant with medication), Hyperlipidemia, DM ( patient states he was but is no longer diabetic and was never on medication) presents to [facility] for left side weakness and slurred speech.  Per patient he has been dragging around his left leg for the past 2-3 months. Yesterday he woke up about 0500 and was fine. He just had some nausea and says that his stomach was messed up. Denies CP, Vomiting, SOB. Patient states that sometime between 0500 and 0900 he developed a HA. Denies taking any medication for HA. About 0930 he still had the HA but he began having trouble speaking and he did report some light sensitivity. He said that his words sounded mumbled and his left arm was weak in addition to the leg so he decided to come to the hospital.  Denies any numbness or tingling in the left side. He reports that he did fall once yesterday but did not hit his head or lose consciousness. Denies smoking, alcohol, or drug usage.  Etoh: negative BG: 106 Tox screen: negative  Last known well: 2 months ago with some acute change at 9:30 PM on 06/10/2018.  Poor historian and unreliable history. tPA Given: No: Outside the window Modified Rankin: Rankin Score=1 NIHSS: 2 (drift left arm and leg)   Past Medical History:  Diagnosis Date  . Diabetes mellitus without complication (HCC)   . HLD (hyperlipidemia)   . Hypertension   . Obesity   . OSA (obstructive sleep apnea)     History reviewed. No pertinent surgical history.  Family History  Adopted: Yes  Family history  unknown: Yes         Social History:  reports that he has never smoked. He has never used smokeless tobacco. He reports that he drank alcohol. He reports that he has current or past drug history.  Allergies: No Known Allergies  Medications:  Scheduled: . aspirin  81 mg Oral Daily  . clopidogrel  75 mg Oral Daily  . enoxaparin (LOVENOX) injection  40 mg Subcutaneous Q24H  . feeding supplement (ENSURE ENLIVE)  237 mL Oral BID BM  . lisinopril  20 mg Oral Daily  . potassium chloride  40 mEq Oral BID  . rosuvastatin  20 mg Oral q1800   Continuous:  ZOX:[id number]:acetaminophen **OR** acetaminophen (TYLENOL) oral liquid 160 mg/5 mL **OR** acetaminophen, hydrALAZINE, senna-docusate  ROS:                                                                                                                                       History obtained from the patient  (Poor historian) General ROS: negative for - chills, fatigue, fever, night sweats, weight gain or weight loss Psychological ROS: negative for - , hallucinations, memory difficulties, mood swings or  Ophthalmic ROS: positive for - blurry vision,  Negative for double vision, eye pain or loss of vision ENT ROS: negative for - epistaxis, nasal discharge, oral lesions, sore throat, tinnitus or vertigo Respiratory ROS: negative for - cough,  shortness of breath or wheezing Cardiovascular ROS: positive for - chest pain,  Negative dyspnea on exertion,  Gastrointestinal ROS: positive for Nausea negative for - abdominal pain, diarrhea, vomiting or stool incontinence Genito-Urinary ROS: negative for - dysuria, hematuria, incontinence or urinary frequency/urgency Musculoskeletal ROS: positive for left arm leg and weakness. No neck pain, back pain Neurological ROS: as noted in HPI   General Examination:                                                                                                       Blood pressure (!) 158/88, pulse 68, temperature 98.5 F (36.9 C), temperature source Oral, resp. rate 20, height 5\' 8"  (1.727 m), weight 97.5 kg (215 lb), SpO2 100 %.  HEENT-  Normocephalic, no lesions, without obvious abnormality.  Normal external eye and conjunctiva.   Cardiovascular-  pulses palpable throughout   Lungs-no excessive working breathing.  Saturations within normal limits on RA Extremities- Warm, dry and intact Musculoskeletal-no joint tenderness, deformity or swelling Skin-warm and dry, intact no hyperpigmentation, vitiligo, or suspicious lesions  Neurological Examination Mental Status: Alert, drowsy, states" I just wants to sleep and be left alone". Oriented, to person/place/time/event and situation. thought content appropriate.  Speech fluent without evidence of aphasia.  Able to follow commands without difficulty. Cranial Nerves: II: Visual fields grossly normal,  III,IV, VI: ptosis not present,  extra-ocular motions intact bilaterally, pupils equal, round, reactive to light and accommodation V,VII: smile symmetric, facial light touch/temperature sensation normal bilaterally VIII: hearing normal bilaterally IX,X: uvula rises symmetrically XI: bilateral shoulder shrug XII: midline tongue extension Motor: Right : Upper extremity   5/5    Left:     Upper extremity   4/5  Lower extremity   5/5     Lower extremity   4/5 Tone and bulk:normal tone throughout; no atrophy noted Sensory:  light touch/ temperature intact throughout, bilaterally Deep Tendon Reflexes: 2+ and symmetric throughout Plantars: Right: downgoing   Left: downgoing Cerebellar: normal finger-to-nose, normal rapid alternating movements and normal heel-to-shin test Gait: did not test   Lab Results: Basic Metabolic Panel: Recent Labs  Lab 06/10/18 1204 06/10/18 1214 06/11/18 0412  NA 139 139 137  K 3.9 3.9 3.2*  CL 105 104 105  CO2 24   --  26  GLUCOSE 94 95 89  BUN 15 17 17   CREATININE 1.34* 1.20 1.38*  CALCIUM 9.1  --  8.5*  MG  --   --  2.0    CBC: Recent Labs  Lab 06/10/18 1204 06/10/18 1214 06/11/18 0412  WBC 6.8  --  6.0  NEUTROABS 4.0  --   --   HGB 13.1 13.6 12.7*  HCT 38.9* 40.0 37.8*  MCV 86.6  --  85.7  PLT 287  --  289    Lipid Panel: Recent Labs  Lab 06/11/18 0412  CHOL 233*  TRIG 199*  HDL 39*  CHOLHDL 6.0  VLDL 40  LDLCALC 960*    CBG: Recent Labs  Lab 06/10/18 1151  GLUCAP 91    Imaging:  CT Head w/o contrast:  Atrophy with supratentorial small vessel disease. Scattered small infarcts are noted at several sites. There is ill-defined decreased attenuation in the anterior limb of the left internal capsule. Question recent and possibly acute infarct in this area. Other apparent infarcts are felt to most likely be older based on their overall appearance. No hemorrhage or mass. There are foci of arterial vascular calcification in each distal carotid artery. There is mucosal thickening in several ethmoid air cells. There is leftward deviation of the anterior nasal septum. Assessment and plan discussed with with attending physician and they are in agreement.    MRI Brain: 1. Motion degraded examination. 2. Acute versus subacute subcentimeter pontine small vessel infarct. 3. 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 4. Moderate chronic small vessel ischemic changes. Possible component of chronic demyelination though, unlikely   Valentina Lucks, MSN, NP-C [facility] [phone]  06/11/2018, 12:42 PM   Attending addendum Patient seen and examined Agree with history physical above Imaging reviewed.  MRI of the brain shows a very small punctate right pontine infarct, likely small vessel etiology.   Assessment: 47 y.o. male  Stroke Risk Factors - diabetes mellitus and hypertension With PMH significant for HTN ( non-compliant with  medication), Hyperlipidemia, DM ( patient states he was but is no longer diabetic and was never on medication) presents to [facility] for left side weakness and slurred speech. Presents to [facility] with 2-3 month history of left leg weakness that became worse yesterday and also included trouble speaking and left arm weakness. 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  Recommend --HgbA1c, fasting lipid panel --CTA head and neck --PT consult, OT consult, Speech consult --Echocardiogram --80 mg of Atorvastatin --Prophylactic therapy-Antiplatelet med: -- aspirin 325 mg po daily.  Depending on the results of the CT angiogram head and neck, might need dual antiplatelet therapy going forward. --Telemetry monitoring --Frequent neuro checks --NPO until passes stroke swallow screen  --please page stroke NP  Or  PA  Or MD from 8am -4 pm  as this patient from this time will be  followed by the stroke.   You can look them up on [email or web address]  Password [email or web address]  -- Milon Dikes, MD [facility] Pager: [phone] If 7pm to 7am, please call on call as listed on AMION.

## 2018-06-11 NOTE — Progress Notes (Signed)
Physical Therapy Evaluation Patient Details Name: Oscar Glenn MRN: [id number] DOB: 08/02/1971 Today's Date: 06/11/2018   History of Present Illness  Patient is a 47 y/o male presenting to ED on 06/10/18 with slurred speech, blurred vision, dizziness, as well as persistent L sided weakness (~3 months). Patient admitted for stroke work-up. Patient with a PMH significant for prediabetes, HTN, HLD, OSA, obesity, depression, anxiety and homelessness.   Clinical Impression  Oscar Glenn is a very pleasant 47 y/o male admitted with the above listed diagnosis. Patient reports that prior to admission patient was independent with all aspects of mobility and worked full time. Today patient requiring Min guard to supervision for transfers and mobility for general safety due to some residual L sided weakness. Patient to benefit from continued PT acutely to maximize mobility and safety. Education on BEFAST with good understanding and carryover. Will recommend outpatient PT at discharge at this time.      Follow Up Recommendations Outpatient PT    Equipment Recommendations  None recommended by PT    Recommendations for Other Services OT consult     Precautions / Restrictions Precautions Precautions: Fall Restrictions Weight Bearing Restrictions: No      Mobility  Bed Mobility Overal bed mobility: Modified Independent                Transfers Overall transfer level: Needs assistance Equipment used: None Transfers: Sit to/from Stand Sit to Stand: Min guard;Supervision         General transfer comment: for safety  Ambulation/Gait Ambulation/Gait assistance: Min guard Ambulation Distance (Feet): 120 Feet Assistive device: None Gait Pattern/deviations: Step-through pattern;Decreased weight shift to left;Narrow base of support;Trendelenburg Gait velocity: decreased   General Gait Details: patient feels "odd" with gait with head turns;  Stairs            Wheelchair Mobility     Modified Rankin (Stroke Patients Only) Modified Rankin (Stroke Patients Only) Pre-Morbid Rankin Score: No symptoms Modified Rankin: Moderately severe disability     Balance Overall balance assessment: Mild deficits observed, not formally tested                                           Pertinent Vitals/Pain Pain Assessment: No/denies pain    Home Living Family/patient expects to be discharged to:: Shelter/Homeless                      Prior Function Level of Independence: Independent         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 [employer]     Hand Dominance        Extremity/Trunk Assessment        Lower Extremity Assessment Lower Extremity Assessment: LLE deficits/detail LLE Deficits / Details: L LE grossly 4-/5       Communication   Communication: No difficulties  Cognition Arousal/Alertness: Awake/alert Behavior During Therapy: WFL for tasks assessed/performed Overall Cognitive Status: Within Functional Limits for tasks assessed                                 General Comments: able to participate in normal conversation without need for redirection      General Comments      Exercises     Assessment/Plan    PT Assessment Patient needs continued PT  services  PT Problem List Decreased strength;Decreased activity tolerance;Decreased balance;Decreased mobility;Decreased knowledge of use of DME;Decreased safety awareness       PT Treatment Interventions Gait training;Stair training;Functional mobility training;Therapeutic activities;Balance training;Therapeutic exercise;Neuromuscular re-education;Patient/family education    PT Goals (Current goals can be found in the Care Plan section)  Acute Rehab PT Goals Patient Stated Goal: improve strength, return to work PT Goal Formulation: With patient Time For Goal Achievement: 06/25/18 Potential to Achieve Goals: Good    Frequency Min 4X/week    Barriers to discharge        Co-evaluation               AM-PAC PT "6 Clicks" Daily Activity  Outcome Measure Difficulty turning over in bed (including adjusting bedclothes, sheets and blankets)?: A Little Difficulty moving from lying on back to sitting on the side of the bed? : A Little Difficulty sitting down on and standing up from a chair with arms (e.g., wheelchair, bedside commode, etc,.)?: Unable Help needed moving to and from a bed to chair (including a wheelchair)?: A Little Help needed walking in hospital room?: A Little Help needed climbing 3-5 steps with a railing? : A Little 6 Click Score: 16    End of Session Equipment Utilized During Treatment: Gait belt Activity Tolerance: Patient tolerated treatment well Patient left: in bed;with call bell/phone within reach;with bed alarm set Nurse Communication: Mobility status PT Visit Diagnosis: Unsteadiness on feet (R26.81);Other abnormalities of gait and mobility (R26.89);Hemiplegia and hemiparesis Hemiplegia - Right/Left: Left Hemiplegia - dominant/non-dominant: Non-dominant Hemiplegia - caused by: Cerebral infarction    Time: 0902-0920 PT Time Calculation (min) (ACUTE ONLY): 18 min   Charges:   PT Evaluation $PT Eval Moderate Complexity: 1 Mod     PT G Codes:         Kipp Laurence, PT, DPT 06/11/18 10:26 AM

## 2018-06-11 NOTE — Plan of Care (Signed)
  Problem: Nutrition: Goal: Adequate nutrition will be maintained Outcome: Progressing   Problem: Safety: Goal: Ability to remain free from injury will improve Outcome: Progressing   Problem: Education: Goal: Knowledge of disease or condition will improve Outcome: Progressing Goal: Knowledge of secondary prevention will improve Outcome: Progressing   Problem: Ischemic Stroke/TIA Tissue Perfusion: Goal: Complications of ischemic stroke/TIA will be minimized Outcome: Progressing

## 2018-06-11 NOTE — Evaluation (Addendum)
Occupational Therapy Evaluation Patient Details Name: Oscar Glenn MRN: [id number] DOB: 27-Feb-1971 Today's Date: 06/11/2018    History of Present Illness Patient is a 47 y/o male presenting to ED on 06/10/18 with slurred speech, blurred vision, dizziness, as well as persistent L sided weakness (~3 months). Patient admitted for stroke work-up. Patient with a PMH significant for prediabetes, HTN, HLD, OSA, obesity, depression, anxiety and homelessness.    Clinical Impression   Pt with mild deficits in balance with decreased coordination, strength and sensation in L UE. AROM is WFL.Pt is min guard A with ADL mobility and LB ADLs. Pt reports that blurred vision has resolved. Pt would benefit from acute OT services to address impairments to maximize level of function and safety    Follow Up Recommendations  No OT follow up    Equipment Recommendations  None recommended by OT    Recommendations for Other Services       Precautions / Restrictions Precautions Precautions: Fall Restrictions Weight Bearing Restrictions: No      Mobility Bed Mobility Overal bed mobility: Modified Independent                Transfers Overall transfer level: Needs assistance Equipment used: None Transfers: Sit to/from Stand Sit to Stand: Min guard;Supervision         General transfer comment: for safety    Balance Overall balance assessment: Mild deficits observed, not formally tested                                         ADL either performed or assessed with clinical judgement   ADL Overall ADL's : Needs assistance/impaired Eating/Feeding: Independent;Sitting   Grooming: Wash/dry hands;Wash/dry face;Standing;Supervision/safety   Upper Body Bathing: Supervision/ safety   Lower Body Bathing: Sit to/from stand;Min guard;Supervison/ safety   Upper Body Dressing : Supervision/safety   Lower Body Dressing: Min guard;Sit to/from stand;Supervision/safety   Toilet  Transfer: Min guard;Ambulation;Regular Social worker and Hygiene: Supervision/safety   Tub/ Engineer, structural: Min guard;Ambulation   Functional mobility during ADLs: Min guard General ADL Comments: pt with mildly decreased strength and coordination of L UE, mild balance deficits     Vision         Perception     Praxis      Pertinent Vitals/Pain Pain Assessment: No/denies pain     Hand Dominance Right   Extremity/Trunk Assessment Upper Extremity Assessment Upper Extremity Assessment: Generalized weakness;LUE deficits/detail LUE Deficits / Details: L UE 3+/5 grossly, decreased sensation LUE Sensation: decreased light touch LUE Coordination: decreased fine motor   Lower Extremity Assessment Lower Extremity Assessment: Defer to PT evaluation LLE Deficits / Details: L LE grossly 4-/5   Cervical / Trunk Assessment Cervical / Trunk Assessment: Normal   Communication Communication Communication: No difficulties   Cognition Arousal/Alertness: Awake/alert Behavior During Therapy: WFL for tasks assessed/performed Overall Cognitive Status: Within Functional Limits for tasks assessed                                 General Comments: able to participate in normal conversation without need for redirection   General Comments       Exercises     Shoulder Instructions      Home Living Family/patient expects to be discharged to:: Shelter/Homeless  Prior Functioning/Environment Level of Independence: Independent        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 [employer]        OT Problem List: Decreased strength;Impaired balance (sitting and/or standing);Decreased coordination;Impaired sensation      OT Treatment/Interventions: Self-care/ADL training;Therapeutic activities;Balance training;Therapeutic exercise;Neuromuscular education;Patient/family education    OT  Goals(Current goals can be found in the care plan section) Acute Rehab OT Goals Patient Stated Goal: improve strength, return to work OT Goal Formulation: With patient Time For Goal Achievement: 06/25/18 Potential to Achieve Goals: Good ADL Goals Pt Will Perform Lower Body Bathing: with supervision;with modified independence;sit to/from stand Pt Will Perform Lower Body Dressing: with supervision;with modified independence;sit to/from stand Pt Will Transfer to Toilet: with supervision;with modified independence;ambulating;regular height toilet Pt Will Perform Tub/Shower Transfer: with supervision;with modified independence;ambulating Additional ADL Goal #1: Pt will participate in L UE strength and coordination exercises to improve functional use/safety for ADLs  OT Frequency: Min 2X/week   Barriers to D/C:    no barriers       Co-evaluation              AM-PAC PT "6 Clicks" Daily Activity     Outcome Measure Help from another person eating meals?: None Help from another person taking care of personal grooming?: None Help from another person toileting, which includes using toliet, bedpan, or urinal?: None Help from another person bathing (including washing, rinsing, drying)?: A Little Help from another person to put on and taking off regular upper body clothing?: None Help from another person to put on and taking off regular lower body clothing?: A Little 6 Click Score: 22   End of Session    Activity Tolerance: Patient tolerated treatment well Patient left: in bed  OT Visit Diagnosis: Unsteadiness on feet (R26.81);Other abnormalities of gait and mobility (R26.89);Muscle weakness (generalized) (M62.81);Hemiplegia and hemiparesis Hemiplegia - Right/Left: Left Hemiplegia - dominant/non-dominant: Non-Dominant Hemiplegia - caused by: Cerebral infarction                Time: [id number] OT Time Calculation (min): 23 min Charges:  OT General Charges $OT Visit: 1 Visit OT  Evaluation $OT Eval Moderate Complexity: 1 Mod G-Codes: OT G-codes **NOT FOR INPATIENT CLASS** Functional Assessment Tool Used: AM-PAC 6 Clicks Daily Activity     Galen ManilaSpencer, Dimas Scheck Jeanette 06/11/2018, 12:42 PM

## 2018-06-11 NOTE — Progress Notes (Signed)
*  PRELIMINARY RESULTS* Vascular Ultrasound Carotid Duplex (Doppler) has been completed.   Findings suggest 1-39% internal carotid artery stenosis bilaterally. Vertebral arteries are patent with antegrade flow.  06/11/2018 3:56 PM Gertie FeyMichelle Karl Knarr, BS, RVT, RDCS, RDMS

## 2018-06-11 NOTE — Progress Notes (Signed)
CSW met with patient to discuss homelessness. Patient confirmed that he has a space at [facility] that he has been staying at for the past few weeks, but he would like to go somewhere else. CSW provided a list of other area shelters, and patient said he'd like to find a home instead of another shelter. CSW is unable to assist from the hospital, but gave information on setting the patient up with the [facility] for assistance with finding housing. CSW provided contact information and assistance available through the [facility] and the patient said that he had been there before and would look into getting help there at discharge.  Patient also had questions about Medicaid and whether or not he would qualify for disability. CSW contacted Financial Counseling office to request follow up with the patient on completing Medicaid and Disability screenings to assist the patient with appropriate financial resources.   CSW signing off.  Laveda Abbe, [place] Clinical Social Worker [phone]

## 2018-06-11 NOTE — Progress Notes (Addendum)
Family Medicine Teaching Service Daily Progress Note Intern Pager: [phone]  Patient name: Oscar Glenn: [id number] Date of birth: 1971-11-07 Age: 47 y.o. Gender: male  Primary Care Provider: Patient, No Pcp Per Consultants: neurology Code Status: full  Pt Overview and Major Events to Date:  Admitted 6/11  Assessment and Plan:  Oscar Glenn, slurred speech, blurred vision. PMH Glenn significant for prediabetes, HTN, HLD, OSA, obesity, depression, anxiety, and homelessness.  L sided Glenn and numbness with blurred vision. Possibly due to acute vs subacute pontine small vessel infarct, seen on MRI, although atypical migraine or MS also possible (given his double vision in the past that has since resolved and the demyelination possibly seen on MRI).  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  Risk stratification labs include A1c of 5.5, elevated lipid panel with LDL of 154.  UDS negative, etoh level negative. - neurology consult, appreciate recommendations - PT/OT/SLP - carotid dopplers - echo - start rosuvastatin 20mg  qd  Hypertensive urgency in the setting of medication noncompliance. BP 199/130 on presentation, now 136/79. Patient received lisinopril 20 mg on 6/12 at midnight. - IV hydralazine 5mg  q4prn for SBP > 180 or DBP >110 - monitor BPs - am EKG   Hypokalemia.  K of 3.2 on 6/12.   - replete with Kdur 40 mEq x 2 - check Mag  H/o prediabetes. Last a1c 6.4 on 04/12/16. A1c 5.5 on 6/12.  HLD - restart statin as per above  H/o OSA. Patient reports undergoing sleep study several years ago but never being placed on CPAP - follow up as outpatient, CM consulted for PCP placement  Depression and anxiety.  Sees a therapist outpatient for this but does not take medications.  Does endorse some symptoms of  mania in the remote past.  Homelessness. Lives at shelter - Social work consulted   FEN/GI: heart healthy Prophylaxis: lovenox  Disposition: continue on telemetry  Subjective:  Oscar Glenn says that he feels some better this morning but continues to endorse left sided altered sensation, especially on his face.  He would like to talk to social work this morning because he says that the homeless shelter Glenn not a good place for him.  Objective: Temp:  [97.7 F (36.5 C)-98.3 F (36.8 C)] 97.9 F (36.6 C) (06/12 0420) Pulse Rate:  [52-64] 57 (06/12 0420) Resp:  [16-25] 16 (06/12 0420) BP: (136-199)/(79-130) 136/79 (06/12 0420) SpO2:  [96 %-100 %] 96 % (06/12 0420) Weight:  [215 lb (97.5 kg)] 215 lb (97.5 kg) (06/11 1146) Physical Exam: General: NAD, lying in bed comfortably Cardiovascular: RRR, no MRG Respiratory: CTAB, no wheezing or crackles Abdomen: soft, nontender, normal bowel sounds Extremities: 4/5 strength on LUE and LLE, 5/5 strength on right Neuro: see extremity exam; some Glenn on L shoulder elevation, altered sensation in maxillary and mandibular distributions of trigeminal nerve on L side  Laboratory: Recent Labs  Lab 06/10/18 1204 06/10/18 1214 06/11/18 0412  WBC 6.8  --  6.0  HGB 13.1 13.6 12.7*  HCT 38.9* 40.0 37.8*  PLT 287  --  289   Recent Labs  Lab 06/10/18 1204 06/10/18 1214 06/11/18 0412  NA 139 139 137  K 3.9 3.9 3.2*  CL 105 104 105  CO2 24  --  26  BUN 15 17 17   CREATININE 1.34* 1.20 1.38*  CALCIUM 9.1  --  8.5*  PROT 7.2  --   --   BILITOT 0.8  --   --   ALKPHOS 63  --   --   ALT 16*  --   --   AST 22  --   --   GLUCOSE 94 95 89     Imaging/Diagnostic Tests: Dg Chest 2 View  Result Date: 06/11/2018 CLINICAL DATA:  Slurred speech and blurry vision. EXAM: CHEST - 2 VIEW COMPARISON:  None. FINDINGS: Top-normal size heart. No aortic aneurysm. Clear lungs. No acute osseous abnormality. IMPRESSION: No active cardiopulmonary disease.  Electronically Signed   By: Tollie Eth M.D.   On: 06/11/2018 00:25   Ct Head Wo Contrast  Result Date: 06/10/2018 CLINICAL DATA:  Blurred vision with dizziness and slurred speech EXAM: CT HEAD WITHOUT CONTRAST TECHNIQUE: Contiguous axial images were obtained from the base of the skull through the vertex without intravenous contrast. COMPARISON:  None. FINDINGS: Brain: The ventricles are normal in size and configuration. There Glenn a cavum septum pellucidum, an anatomic variant. There Glenn no intracranial mass, hemorrhage, extra-axial fluid collection, or midline shift. There Glenn a prior small infarct in the anterior right lentiform nucleus. There Glenn small vessel disease with several small lacunar infarcts in the centra semiovale bilaterally. There are small lacunar type infarcts in the left thalamus. There Glenn a prior lacunar infarct in the left mid pons region anteriorly. There Glenn decreased attenuation in the region of the anterior limb of the left internal capsule which Glenn somewhat ill-defined and may represent a recent/acute infarct. Vascular: No hyperdense vessel evident. There Glenn mild calcification in each cavernous carotid artery region. Skull: Bony calvarium appears intact. Sinuses/Orbits: There Glenn mucosal thickening in several ethmoid air cells bilaterally. Other visualized paranasal sinuses are clear. There Glenn leftward deviation of the anterior nasal septum. Orbits appear symmetric bilaterally. Other: Mastoid air cells are clear. IMPRESSION: Atrophy with supratentorial small vessel disease. Scattered small infarcts are noted at several sites. There Glenn ill-defined decreased attenuation in the anterior limb of the left internal capsule. Question recent and possibly acute infarct in this area. Other apparent infarcts are felt to most likely be older based on their overall appearance. No hemorrhage or mass. There are foci of arterial vascular calcification in each distal carotid artery. There Glenn mucosal  thickening in several ethmoid air cells. There Glenn leftward deviation of the anterior nasal septum. Electronically Signed   By: Bretta Bang III M.D.   On: 06/10/2018 13:26   Mr Brain Wo Contrast  Result Date: 06/10/2018 CLINICAL DATA:  Slurred speech, blurry vision, dizziness intermittent for 2 months. History of hypertension, hyperlipidemia and diabetes. EXAM: MRI HEAD WITHOUT CONTRAST TECHNIQUE: Multiplanar, multiecho pulse sequences of the brain and surrounding structures were obtained without intravenous contrast. COMPARISON:  CT HEAD June 10, 2018 FINDINGS: Moderately motion degraded examination, patient Glenn sleepy and unable to hold still. INTRACRANIAL CONTENTS: Subcentimeter reduced diffusion RIGHT parasagittal ventral pons, difficult to characterize on ADC maps. Old LEFT pontine lacunar infarct. Old bilateral basal ganglia and LEFT greater than RIGHT thalamus infarcts. Susceptibility artifact RIGHT basal ganglia associated with old infarct. Scattered chronic microhemorrhages associated with chronic hypertension. Patchy to confluent supratentorial white matter FLAIR T2 hyperintensities, some of which demonstrate cystic component. No parenchymal brain volume loss for age. Cavum septum pellucidum et vergae, normal variant. No midline shift, mass effect mass effect or masses. 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 SKULL AND UPPER CERVICAL SPINE: No  abnormal sellar expansion. No suspicious calvarial bone marrow signal. Craniocervical junction maintained. SINUSES/ORBITS: The mastoid air-cells and included paranasal sinuses are well-aerated.The included ocular globes and orbital contents are non-suspicious. OTHER: None. IMPRESSION: 1. Motion degraded examination. 2. Acute versus subacute subcentimeter pontine small vessel infarct. 3. 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 4. Moderate chronic small vessel  ischemic changes. Possible component of chronic demyelination though, unlikely. Electronically Signed   By: Awilda Metro M.D.   On: 06/10/2018 19:08     Lennox Solders, MD 06/11/2018, 7:03 AM PGY-1, [facility] FPTS Intern pager: [phone], text pages welcome

## 2018-06-12 DIAGNOSIS — I6300 Cerebral infarction due to thrombosis of unspecified precerebral artery: Secondary | ICD-10-CM

## 2018-06-12 DIAGNOSIS — I10 Essential (primary) hypertension: Secondary | ICD-10-CM

## 2018-06-12 DIAGNOSIS — R7303 Prediabetes: Secondary | ICD-10-CM

## 2018-06-12 LAB — BASIC METABOLIC PANEL
ANION GAP: 10 (ref 5–15)
BUN: 13 mg/dL (ref 6–20)
CO2: 26 mmol/L (ref 22–32)
Calcium: 8.8 mg/dL — ABNORMAL LOW (ref 8.9–10.3)
Chloride: 102 mmol/L (ref 101–111)
Creatinine, Ser: 1.33 mg/dL — ABNORMAL HIGH (ref 0.61–1.24)
GFR calc Af Amer: >60 mL/min (ref >60)
Glucose, Bld: 166 mg/dL — ABNORMAL HIGH (ref 65–99)
POTASSIUM: 3.9 mmol/L (ref 3.5–5.1)
Sodium: 138 mmol/L (ref 135–145)

## 2018-06-12 MED ORDER — ATORVASTATIN CALCIUM 80 MG PO TABS: 80.0000 mg | ORAL_TABLET | Freq: Every day | ORAL | Status: DC

## 2018-06-12 MED ORDER — ASPIRIN 81 MG PO CHEW: 324.0000 mg | CHEWABLE_TABLET | Freq: Every day | ORAL | 1 refills | Status: AC

## 2018-06-12 MED ORDER — ATORVASTATIN CALCIUM 80 MG PO TABS: 80.0000 mg | ORAL_TABLET | Freq: Every day | ORAL | 1 refills | Status: AC

## 2018-06-12 MED ORDER — LOSARTAN POTASSIUM-HCTZ 100-25 MG PO TABS: 1.0000 | ORAL_TABLET | Freq: Every day | ORAL | 1 refills | Status: AC

## 2018-06-12 MED ORDER — CLOPIDOGREL BISULFATE 75 MG PO TABS: 75.0000 mg | ORAL_TABLET | Freq: Every day | ORAL | 0 refills | Status: AC

## 2018-06-12 MED FILL — LOSARTAN-HCTZ 100-25 MG TAB: 100-25 | 30 days supply | Qty: 30 | Fill #0 | Status: TO

## 2018-06-12 MED FILL — ATORVASTATIN 80 MG TABLET: 80 | 30 days supply | Qty: 30 | Fill #0 | Status: TO

## 2018-06-12 MED FILL — CLOPIDOGREL 75 MG TABLET: 75 | 21 days supply | Qty: 21 | Fill #0

## 2018-06-12 NOTE — Progress Notes (Addendum)
Occupational Therapy Treatment Patient Details Name: Marigene EhlersRonald Metallo MRN: [id number] DOB: 1971/12/28 Today's Date: 06/12/2018    History of present illness Patient is a 47 y/o male presenting to ED on 06/10/18 with slurred speech, blurred vision, dizziness, as well as persistent L sided weakness (~3 months). Patient admitted for stroke work-up. Patient with a PMH significant for prediabetes, HTN, HLD, OSA, obesity, depression, anxiety and homelessness.    OT comments  Pt making progress with functional goals. Pt instructed on L shoulder strenghtening exercises with level 2 orange T band and pt able to return demo. Pt stood at sink to open 10/10 ADL items with minimal difficulty and no physical assist. OT will continue to follow acutely  Follow Up Recommendations  No OT follow up    Equipment Recommendations  None recommended by OT    Recommendations for Other Services      Precautions / Restrictions Precautions Precautions: Fall Restrictions Weight Bearing Restrictions: No       Mobility Bed Mobility Overal bed mobility: Modified Independent                Transfers Overall transfer level: Needs assistance Equipment used: None Transfers: Sit to/from Stand Sit to Stand: Supervision         General transfer comment: for safety    Balance Overall balance assessment: Mild deficits observed, not formally tested                                         ADL either performed or assessed with clinical judgement   ADL                                         General ADL Comments: pt stood at sink using L hand and to open 10/10 items with minimal difficulty and no physical assist     Vision Patient Visual Report: No change from baseline     Perception     Praxis      Cognition Arousal/Alertness: Awake/alert Behavior During Therapy: WFL for tasks assessed/performed Overall Cognitive Status: Within Functional Limits for tasks  assessed                                          Exercises Other Exercises Other Exercises: pt instructed on L shoulder strenghtening exercises with level 2 orange T band and pt able to return demo   Shoulder Instructions       General Comments      Pertinent Vitals/ Pain       Pain Assessment: Faces Faces Pain Scale: Hurts little more Pain Location: ROM and MMT of L shoulder >120 degrees  Home Living                                          Prior Functioning/Environment              Frequency  Min 2X/week        Progress Toward Goals  OT Goals(current goals can now be found in the care plan section)  Progress towards OT goals: Progressing toward goals  Plan Discharge plan remains appropriate    Co-evaluation                 AM-PAC PT "6 Clicks" Daily Activity     Outcome Measure                    End of Session    OT Visit Diagnosis: Unsteadiness on feet (R26.81);Other abnormalities of gait and mobility (R26.89);Muscle weakness (generalized) (M62.81);Hemiplegia and hemiparesis;Pain Hemiplegia - Right/Left: Left Hemiplegia - dominant/non-dominant: Non-Dominant Hemiplegia - caused by: Cerebral infarction Pain - Right/Left: Left Pain - part of body: Shoulder   Activity Tolerance Patient tolerated treatment well   Patient Left in bed with phone/call bell within reach   Nurse Communication      Functional Assessment Tool Used: AM-PAC 6 Clicks Daily Activity   Time: 1610-9604 OT Time Calculation (min): 18 min  Charges: OT G-codes **NOT FOR INPATIENT CLASS** Functional Assessment Tool Used: AM-PAC 6 Clicks Daily Activity OT General Charges $OT Visit: 1 Visit OT Treatments $Therapeutic Activity: 8-22 mins     Galen Manila 06/12/2018, 1:18 PM

## 2018-06-12 NOTE — Progress Notes (Signed)
SLP Cancellation Note  Patient Details Name: Oscar EhlersRonald Glenn MRN: [id number] DOB: [birth year]   Cancelled treatment:       Reason Eval/Treat Not Completed: SLP screened, no needs identified, will sign off   Malaya Cagley, Riley NearingBonnie Caroline 06/12/2018, 2:18 PM

## 2018-06-12 NOTE — Progress Notes (Addendum)
NEUROHOSPITALISTS STROKE TEAM - DAILY PROGRESS NOTE   HPI ( Dr Wilford Corner) Oscar Glenn is an 47 y.o. male  With PMH significant for HTN ( non-compliant with medication), Hyperlipidemia, DM ( patient states he was but is no longer diabetic and was never on medication) presents to [facility] for left side weakness and slurred speech.  Per patient he has been dragging around his left leg for the past 2-3 months. Yesterday he woke up about 0500 and was fine. He just had some nausea and says that his stomach was messed up. Denies CP, Vomiting, SOB. Patient states that sometime between 0500 and 0900 he developed a HA. Denies taking any medication for HA. About 0930 he still had the HA but he began having trouble speaking and he did report some light sensitivity. He said that his words sounded mumbled and his left arm was weak in addition to the leg so he decided to come to the hospital.  Denies any numbness or tingling in the left side. He reports that he did fall once yesterday but did not hit his head or lose consciousness. Denies smoking, alcohol, or drug usage.  Etoh: negative BG: 106 Tox screen: negative  Last known well: 2 months ago with some acute change at 9:30 PM on 06/10/2018.  Poor historian and unreliable history. tPA Given: No: Outside the window Modified Rankin: Rankin Score=1 NIHSS: 2 (drift left arm and leg      SUBJECTIVE Patient in bed in no acute distress, he admits to lingering headache, denies dizziness, blurred vision, nausea or vomiting.  But admits to decreased sensation on the left side.  Patient states, he has not been able to take his medications for several months as he currently is homeless but is employed without health insurance.  He lives in a shelter.  OBJECTIVE Most recent Vital Signs: Vitals:   06/12/18 0012 06/12/18 0416 06/12/18 0752 06/12/18 1223  BP: 129/77 (!) 152/110 (!) 161/105 (!) 163/101  Pulse: 72 (!) 57  (!) 56 63  Resp: 15 16 16 20   Temp: 98.5 F (36.9 C) 98.6 F (37 C) 98.5 F (36.9 C) 98.3 F (36.8 C)  TempSrc: Oral Oral Oral Oral  SpO2: 100% 98% 99% 96%  Weight:      Height:       CBG (last 3)  Recent Labs    06/10/18 1151  GLUCAP 91    Physical Exam  HEENT-  Normocephalic, no lesions, without obvious abnormality.  Normal external eye and conjunctiva.   Cardiovascular- S1-S2 audible, pulses palpable throughout   Lungs-no rhonchi or wheezing noted, no excessive working breathing.   Abdomen- All 4 quadrants palpated and nontender Musculoskeletal-no joint tenderness, deformity or swelling Skin-warm and dry,   Neuro:  Mental Status: Alert, oriented, thought content appropriate.  Speech fluent without evidence of aphasia, patient with slowed speech, able to follow 3 step commands without difficulty. Cranial Nerves: II:  Visual fields grossly normal,  III,IV, VI: ptosis not present, extra-ocular motions intact bilaterally pupils equal, round, reactive to light and accommodation V,VII: smile symmetric, facial light touch sensation normal bilaterally VIII: hearing intact to voice IX,X: uvula rises symmetrically XI: bilateral shoulder shrug XII: midline tongue extension  Motor: Left lower extremity subtle drift with 4 out of 5 weakness, left upper extremity also with 4/5 weakness with left hand grips and left hand fine motor deficits Right : Upper extremity   5/5    Left:     Upper extremity   5/5  Lower extremity   5/5     Lower extremity   5/5 Tone and bulk:normal tone throughout; no atrophy noted Sensory: Left hemisensory loss Cerebellar: normal finger-to-nose, normal rapid alternating movements  Gait: Not assessed  IV Fluid Intake:    MEDICATIONS  . aspirin  324 mg Oral Daily  . atorvastatin  80 mg Oral q1800  . clopidogrel  75 mg Oral Daily  . enoxaparin (LOVENOX) injection  40 mg Subcutaneous Q24H  . feeding supplement (ENSURE ENLIVE)  237 mL Oral BID BM   PRN:   acetaminophen **OR** acetaminophen (TYLENOL) oral liquid 160 mg/5 mL **OR** acetaminophen, hydrALAZINE, senna-docusate  Diet:   Diet Order           Diet Heart Room service appropriate? Yes; Fluid consistency: Thin  Diet effective now           CLINICALLY SIGNIFICANT STUDIES Basic Metabolic Panel:  Recent Labs  Lab 06/11/18 0412 06/12/18 0858  NA 137 138  K 3.2* 3.9  CL 105 102  CO2 26 26  GLUCOSE 89 166*  BUN 17 13  CREATININE 1.38* 1.33*  CALCIUM 8.5* 8.8*  MG 2.0  --    Liver Function Tests:  Recent Labs  Lab 06/10/18 1204  AST 22  ALT 16*  ALKPHOS 63  BILITOT 0.8  PROT 7.2  ALBUMIN 4.1   CBC:  Recent Labs  Lab 06/10/18 1204 06/10/18 1214 06/11/18 0412  WBC 6.8  --  6.0  NEUTROABS 4.0  --   --   HGB 13.1 13.6 12.7*  HCT 38.9* 40.0 37.8*  MCV 86.6  --  85.7  PLT 287  --  289   Coagulation:  Recent Labs  Lab 06/10/18 1204  LABPROT 12.9  INR 0.98   Cardiac Enzymes: No results for input(s): CKTOTAL, CKMB, CKMBINDEX, TROPONINI in the last 168 hours. Urinalysis:  Recent Labs  Lab 06/10/18 2340  COLORURINE STRAW*  LABSPEC 1.010  PHURINE 7.0  GLUCOSEU NEGATIVE  HGBUR NEGATIVE  BILIRUBINUR NEGATIVE  KETONESUR NEGATIVE  PROTEINUR NEGATIVE  NITRITE NEGATIVE  LEUKOCYTESUR NEGATIVE   Lipid Panel    Component Value Date/Time   CHOL 233 (H) 06/11/2018 0412   TRIG 199 (H) 06/11/2018 0412   HDL 39 (L) 06/11/2018 0412   CHOLHDL 6.0 06/11/2018 0412   VLDL 40 06/11/2018 0412   LDLCALC 154 (H) 06/11/2018 0412   HgbA1C  Lab Results  Component Value Date   HGBA1C 5.5 06/11/2018    Urine Drug Screen:      Component Value Date/Time   LABOPIA NONE DETECTED 06/10/2018 2340   COCAINSCRNUR NONE DETECTED 06/10/2018 2340   LABBENZ NONE DETECTED 06/10/2018 2340   AMPHETMU NONE DETECTED 06/10/2018 2340   THCU NONE DETECTED 06/10/2018 2340   LABBARB NONE DETECTED 06/10/2018 2340    Alcohol Level:  Recent Labs  Lab 06/10/18 2106  ETH <10   Dg  Chest 2 View  06/11/2018 IMPRESSION:  No active cardiopulmonary disease.   Ct Angio head/NeckW Or Wo Contrast  06/11/2018 IMPRESSION: 1. No emergent large vessel occlusion.  2. Multifocal severe stenosis bilateral PCA, multifocal moderate stenosis bilateral ACA and MCA compatible with atherosclerosis.  3. Moderate stenosis bilateral vertebral arteries.  4.  6 mm fusiform basilar artery aneurysm. Aortic Atherosclerosis (ICD10-I70.0).   Oscar Brain Wo Contrast  06/10/2018  IMPRESSION: 1. Motion degraded examination. 2. Acute versus subacute subcentimeter pontine small vessel infarct. 3. 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 4. Moderate chronic small vessel ischemic changes. Possible component of chronic demyelination though, unlikely.   Echo: 06/11/18 - Left ventricle: The cavity size was normal. Systolic function was   normal. The estimated ejection fraction was in the range of 55%   to 60%. Wall motion was normal; there were no regional wall   motion abnormalities. - Left atrium: The atrium was mildly dilated. - Right atrium: The atrium was mildly dilated. - Atrial septum: No defect or patent foramen ovale was identified.  Carotid Doppler: 06/11/18 Right Carotid: Velocities in the right ICA are consistent with a 1-39% stenosis. Left Carotid: Velocities in the left ICA are consistent with a 1-39% stenosis. Vertebrals: Bilateral vertebral arteries demonstrate antegrade flow.  EKG normal sinus rhythm, nonspecific T wave abnormalities. heart rate 70s   ASSESSMENT/PLAN Oscar Glenn  is a 47 year old male, who [facility] with 2-3 month history of left leg weakness that became worse yesterday and also included trouble speaking and left arm weakness. MRI showed acute/subacute right pontine area of infarct and Old pontine, old basal ganglia and old thalami infarcts along wth moderate chronic small vessel disease Current symptoms may or may not be related to the small stroke, but  given risk factors, he does warrant a stroke risk factor work up  Punctate pontine stroke:    Likely secondary to medication noncompliance, uncontrolled hypertension and small vessel disease  CTA head & neck : No emergent LVO, Multifocal severe stenosis bilateral PCA, multifocal moderate stenosis bilateral ACA and MCA compatible with atherosclerosis.   CT of the brain: Scattered small infarcts at several sites  MRI of the brain : Acute versus subacute subcentimeter pontine small vessel infarct, multiple old infarcts  Carotid Doppler :Velocities in the right/left ICA are consistent with a 1-39% stenosis.  2D Echocardiogram : no source of apical thrombus, EF 55 to 60%  CXR no acute abnormalities  LDL 154  HgbA1c  5.5  VTE : Lovenox  Antiplatelets : Dual antiplatelet aspirin 81 mg and Plavix 75 mg x 3 weeks and then continue aspirin alone  Atorvastatin 80 mg  Therapy recommendations:   Outpatient physical therapy  Disposition: Home-patient states lives in the shelter, he does work, case management consulted to assist with acquisition of medication.  Discussed the importance of medication compliance with patient who verbalized understanding  [facility] post stroke     Hypertension  Elevated att his time SBP >160s  Ok to resume standard post stroke BP goal < 220/110  Home medications: None  BP goal normotensive  Medicine team to start antihypertensive regimen   Hyperlipidemia  Home meds:    LDL 154 goal < 70  Add lipitor 80mg  mg daily - more cost effective option  Continue statin at discharge   Other Stroke Risk Factors  Diabetes mellitus hemoglobin A1c 5.5   Dyslipidemia  Hypertension  ETOH Use  Neurology will sign off at this time, please call with further questions Hospital day # 2  SIGNED Noralee Spaceearl Nyanor [facility] Neuro-hospitalist Team [phone] 06/12/2018, 2:35 PM   06/12/2018 ATTENDING ASSESSMENT   I  have personally examined this patient, reviewed HPI,notes, independently viewed imaging studies, participated in medical decision making and plan of care.ROS completed by  me personally and pertinent positives fully documented  I have made any additions or clarifications directly to the above note. Agree with note above.I have counseled the patient to be compliant with his medications and risk factor control. Recommend dual antiplatelet therapy for 3 weeks followed by aspirin alone.Case manager consult to help patient with finding a primary physician and consistent with his medications Greater than 50% time during this 35 minute visit was spent on counseling and coordination of care about his lacunar infarct and answering questions Delia Heady, MD Medical Director [facility] Pager: [phone] 06/12/2018 3:42 PM     To contact Stroke Continuity provider, please refer to [email or web address]. After hours, contact General Neurology

## 2018-06-12 NOTE — Discharge Summary (Signed)
Family Medicine Teaching [facility] Discharge Summary  Patient name: Oscar Glenn Medical record number: [id number] Date of birth: 21-Jun-1971 Age: 47 y.o. 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 Hypertension HLD OSA, no CPAP Depression and anxiety Homelessness  Disposition: home  Discharge Condition: stable, improved  Discharge Exam:   General: NAD, lying in bed comfortably Cardiovascular: RRR, no MRG Respiratory: CTAB, no wheezing or crackles Abdomen: soft, nontender, normal bowel sounds Extremities: 4/5 strength on LUE and LLE, 5/5 strength on right Neuro: see extremity exam; some weakness on L shoulder elevation, altered sensation in maxillary and mandibular 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  CTA head and neck was significant for severe stenosis of the bilateral PCA with moderate stenosis of the ACA and MCA, consistent with atherosclerosis.  LDL was 154, although his W0J was normal.  He was also found to have hypertensive urgency with BP 190s/100s on presentation.  Patient's symptoms were thought to be due to nonadherence to previously prescribed hypertension and cholesterol medications.  Further stroke workup was negative, with no embolic source found on echo and carotid arteries with 1-39% stenosis on doppler.    Patient was prescribed ASA, Plavix, and Lipitor 80 mg for further stroke prevention and losartan-HCTZ for blood pressure control.  Since patient lives in a homeless  shelter and does not have insurance, social work was consulted to help patient obtain outpatient medications.  He was scheduled for follow up with [facility] six weeks after discharge and [facility] for primary care.  He was also referred for outpatient physical therapy to improve his weakness.   Issues for Follow Up:  1. Closely monitor patient's blood pressure and adjust hypertension medication as necessary.  He may benefit from addition of amlodipine if BP is still uncontrolled. 2. Please ensure that patient is able to obtain his medications and go to physical therapy outpatient given his poor social situation.  Patient had mentioned that he does not want to continue living in the shelter and he has a job, so any resources to help him find a better home situation would be greatly beneficial.  Significant Procedures: none  Significant Labs and Imaging:  Recent Labs  Lab 06/10/18 1204 06/10/18 1214 06/11/18 0412  WBC 6.8  --  6.0  HGB 13.1 13.6 12.7*  HCT 38.9* 40.0 37.8*  PLT 287  --  289   Recent Labs  Lab 06/10/18 1204 06/10/18 1214 06/11/18 0412 06/12/18 0858  NA 139 139 137 138  K 3.9 3.9 3.2* 3.9  CL 105 104 105 102  CO2 24  --  26 26  GLUCOSE 94 95 89 166*  BUN 15 17 17 13   CREATININE 1.34* 1.20 1.38* 1.33*  CALCIUM 9.1  --  8.5* 8.8*  MG  --   --  2.0  --   ALKPHOS 63  --   --   --   AST 22  --   --   --   ALT 16*  --   --   --  ALBUMIN 4.1  --   --   --    Ct Angio Head W Or Wo Contrast  Result Date: 06/11/2018 CLINICAL DATA:  Follow up code stroke. EXAM: CT ANGIOGRAPHY HEAD AND NECK TECHNIQUE: Multidetector CT imaging of the head and neck was performed using the standard protocol during bolus administration of intravenous contrast. Multiplanar CT image reconstructions and MIPs were obtained to evaluate the vascular anatomy. Carotid stenosis measurements (when applicable) are obtained utilizing NASCET criteria, using the  distal internal carotid diameter as the denominator. CONTRAST:  50mL ISOVUE-370 IOPAMIDOL (ISOVUE-370) INJECTION 76% COMPARISON:  CT HEAD June 10, 2018 and MRI of the head June 10, 2018. FINDINGS: CT HEAD FINDINGS BRAIN: No intraparenchymal hemorrhage, mass effect nor midline shift. No acute large vascular territory infarct. Old bilateral basal ganglia and thalami infarcts. Patchy to confluent supratentorial white matter hypodensities. Pole LEFT pontine infarct. No parenchymal brain volume loss for age, cavum septum pellucidum et vergae. No abnormal extra-axial fluid collections. Basal cisterns are patent. VASCULAR: Trace calcific atherosclerosis carotid siphons. SKULL/SOFT TISSUES: No skull fracture. No significant soft tissue swelling. ORBITS/SINUSES: Old LEFT medial orbital blowout fracture.The mastoid aircells and included paranasal sinuses are well-aerated. OTHER: None. CTA NECK FINDINGS: AORTIC ARCH: Ascending aorta is 3.5 cm in transaxial dimension, top-normal. The origins of the innominate, left Common carotid artery and subclavian artery are widely patent. RIGHT CAROTID SYSTEM: Common carotid artery is patent. Moderate calcific atherosclerosis of the carotid bifurcation without hemodynamically significant stenosis by NASCET criteria. Normal appearance of the internal carotid artery. LEFT CAROTID SYSTEM: Common carotid artery is patent. Normal appearance of the carotid bifurcation without hemodynamically significant stenosis by NASCET criteria. Normal appearance of the internal carotid artery. VERTEBRAL ARTERIES:LEFT vertebral artery arises directly from the aortic arch, moderate stenosis LEFT vertebral artery origin. RIGHT vertebral artery is dominant. SKELETON: No acute osseous process though bone windows have not been submitted. Mild degenerative change of the cervical spine. OTHER NECK: Soft tissues of the neck are nonacute though, not tailored for evaluation. UPPER CHEST: Included lung apices are clear.  No superior mediastinal lymphadenopathy. Enlarged main pulmonary artery seen with chronic pulmonary arterial hypertension. CTA HEAD FINDINGS: ANTERIOR CIRCULATION: Patent cervical internal carotid arteries, petrous, cavernous and supra clinoid internal carotid arteries. Patent anterior communicating artery. Patent anterior and middle cerebral arteries. Moderate tandem stenosis bilateral MCA and ACA. No large vessel occlusion, significant stenosis, contrast extravasation or aneurysm. POSTERIOR CIRCULATION: Patent vertebral arteries, vertebrobasilar junction and basilar artery, as well as main branch vessels. Moderate stenosis bilateral V4 segments. Fusiform 6 mm proximal basilar artery aneurysm, mild mid basilar artery stenosis. Patent posterior cerebral arteries. Severe tandem stenosis bilateral posterior cerebral arteries. No large vessel occlusion, significant stenosis, contrast extravasation or aneurysm. VENOUS SINUSES: Major dural venous sinuses are patent though not tailored for evaluation on this angiographic examination. ANATOMIC VARIANTS: None. DELAYED PHASE: No abnormal intracranial enhancement. MIP images reviewed. IMPRESSION: CT HEAD: 1. No acute intracranial process. No acute pontine infarct not apparent on CT. 2. Redemonstration old basal ganglia, old thalami and old pontine small infarcts. 3. Moderate chronic small vessel ischemic changes. CTA NECK: 1. No hemodynamically significant stenosis carotid arteries. Advanced atherosclerosis. 2. Moderate stenosis LEFT vertebral artery origin. CTA HEAD: 1. No emergent large vessel occlusion. 2. Multifocal severe stenosis bilateral PCA, multifocal moderate stenosis bilateral ACA and MCA compatible with atherosclerosis. 3. Moderate stenosis bilateral vertebral arteries. 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 Aortic Atherosclerosis (ICD10-I70.0). Electronically Signed   By: Awilda Metro M.D.   On:  06/11/2018 19:46   Dg Chest 2 View  Result Date:  06/11/2018 CLINICAL DATA:  Slurred speech and blurry vision. EXAM: CHEST - 2 VIEW COMPARISON:  None. FINDINGS: Top-normal size heart. No aortic aneurysm. Clear lungs. No acute osseous abnormality. IMPRESSION: No active cardiopulmonary disease. Electronically Signed   By: Tollie Eth M.D.   On: 06/11/2018 00:25   Ct Head Wo Contrast  Result Date: 06/10/2018 CLINICAL DATA:  Blurred vision with dizziness and slurred speech EXAM: CT HEAD WITHOUT CONTRAST TECHNIQUE: Contiguous axial images were obtained from the base of the skull through the vertex without intravenous contrast. COMPARISON:  None. FINDINGS: Brain: The ventricles are normal in size and configuration. There is a cavum septum pellucidum, an anatomic variant. There is no intracranial mass, hemorrhage, extra-axial fluid collection, or midline shift. There is a prior small infarct in the anterior right lentiform nucleus. There is small vessel disease with several small lacunar infarcts in the centra semiovale bilaterally. There are small lacunar type infarcts in the left thalamus. There is a prior lacunar infarct in the left mid pons region anteriorly. There is decreased attenuation in the region of the anterior limb of the left internal capsule which is somewhat ill-defined and may represent a recent/acute infarct. Vascular: No hyperdense vessel evident. There is mild calcification in each cavernous carotid artery region. Skull: Bony calvarium appears intact. Sinuses/Orbits: There is mucosal thickening in several ethmoid air cells bilaterally. Other visualized paranasal sinuses are clear. There is leftward deviation of the anterior nasal septum. Orbits appear symmetric bilaterally. Other: Mastoid air cells are clear. IMPRESSION: Atrophy with supratentorial small vessel disease. Scattered small infarcts are noted at several sites. There is ill-defined decreased attenuation in the anterior limb of the left internal capsule. Question recent and possibly acute  infarct in this area. Other apparent infarcts are felt to most likely be older based on their overall appearance. No hemorrhage or mass. There are foci of arterial vascular calcification in each distal carotid artery. There is mucosal thickening in several ethmoid air cells. There is leftward deviation of the anterior nasal septum. Electronically Signed   By: Bretta Bang III M.D.   On: 06/10/2018 13:26   Ct Angio Neck W Or Wo Contrast  Result Date: 06/11/2018 CLINICAL DATA:  Follow up code stroke. EXAM: CT ANGIOGRAPHY HEAD AND NECK TECHNIQUE: Multidetector CT imaging of the head and neck was performed using the standard protocol during bolus administration of intravenous contrast. Multiplanar CT image reconstructions and MIPs were obtained to evaluate the vascular anatomy. Carotid stenosis measurements (when applicable) are obtained utilizing NASCET criteria, using the distal internal carotid diameter as the denominator. CONTRAST:  50mL ISOVUE-370 IOPAMIDOL (ISOVUE-370) INJECTION 76% COMPARISON:  CT HEAD June 10, 2018 and MRI of the head June 10, 2018. FINDINGS: CT HEAD FINDINGS BRAIN: No intraparenchymal hemorrhage, mass effect nor midline shift. No acute large vascular territory infarct. Old bilateral basal ganglia and thalami infarcts. Patchy to confluent supratentorial white matter hypodensities. Pole LEFT pontine infarct. No parenchymal brain volume loss for age, cavum septum pellucidum et vergae. No abnormal extra-axial fluid collections. Basal cisterns are patent. VASCULAR: Trace calcific atherosclerosis carotid siphons. SKULL/SOFT TISSUES: No skull fracture. No significant soft tissue swelling. ORBITS/SINUSES: Old LEFT medial orbital blowout fracture.The mastoid aircells and included paranasal sinuses are well-aerated. OTHER: None. CTA NECK FINDINGS: AORTIC ARCH: Ascending aorta is 3.5 cm in transaxial dimension, top-normal. The origins of the innominate, left Common carotid artery and subclavian  artery are widely patent. RIGHT CAROTID SYSTEM: Common  carotid artery is patent. Moderate calcific atherosclerosis of the carotid bifurcation without hemodynamically significant stenosis by NASCET criteria. Normal appearance of the internal carotid artery. LEFT CAROTID SYSTEM: Common carotid artery is patent. Normal appearance of the carotid bifurcation without hemodynamically significant stenosis by NASCET criteria. Normal appearance of the internal carotid artery. VERTEBRAL ARTERIES:LEFT vertebral artery arises directly from the aortic arch, moderate stenosis LEFT vertebral artery origin. RIGHT vertebral artery is dominant. SKELETON: No acute osseous process though bone windows have not been submitted. Mild degenerative change of the cervical spine. OTHER NECK: Soft tissues of the neck are nonacute though, not tailored for evaluation. UPPER CHEST: Included lung apices are clear. No superior mediastinal lymphadenopathy. Enlarged main pulmonary artery seen with chronic pulmonary arterial hypertension. CTA HEAD FINDINGS: ANTERIOR CIRCULATION: Patent cervical internal carotid arteries, petrous, cavernous and supra clinoid internal carotid arteries. Patent anterior communicating artery. Patent anterior and middle cerebral arteries. Moderate tandem stenosis bilateral MCA and ACA. No large vessel occlusion, significant stenosis, contrast extravasation or aneurysm. POSTERIOR CIRCULATION: Patent vertebral arteries, vertebrobasilar junction and basilar artery, as well as main branch vessels. Moderate stenosis bilateral V4 segments. Fusiform 6 mm proximal basilar artery aneurysm, mild mid basilar artery stenosis. Patent posterior cerebral arteries. Severe tandem stenosis bilateral posterior cerebral arteries. No large vessel occlusion, significant stenosis, contrast extravasation or aneurysm. VENOUS SINUSES: Major dural venous sinuses are patent though not tailored for evaluation on this angiographic examination. ANATOMIC  VARIANTS: None. DELAYED PHASE: No abnormal intracranial enhancement. MIP images reviewed. IMPRESSION: CT HEAD: 1. No acute intracranial process. No acute pontine infarct not apparent on CT. 2. Redemonstration old basal ganglia, old thalami and old pontine small infarcts. 3. Moderate chronic small vessel ischemic changes. CTA NECK: 1. No hemodynamically significant stenosis carotid arteries. Advanced atherosclerosis. 2. Moderate stenosis LEFT vertebral artery origin. CTA HEAD: 1. No emergent large vessel occlusion. 2. Multifocal severe stenosis bilateral PCA, multifocal moderate stenosis bilateral ACA and MCA compatible with atherosclerosis. 3. Moderate stenosis bilateral vertebral arteries. 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 Aortic Atherosclerosis (ICD10-I70.0). Electronically Signed   By: Awilda Metro M.D.   On: 06/11/2018 19:46   Mr Brain Wo Contrast  Result Date: 06/10/2018 CLINICAL DATA:  Slurred speech, blurry vision, dizziness intermittent for 2 months. History of hypertension, hyperlipidemia and diabetes. EXAM: MRI HEAD WITHOUT CONTRAST TECHNIQUE: Multiplanar, multiecho pulse sequences of the brain and surrounding structures were obtained without intravenous contrast. COMPARISON:  CT HEAD June 10, 2018 FINDINGS: Moderately motion degraded examination, patient is sleepy and unable to hold still. INTRACRANIAL CONTENTS: Subcentimeter reduced diffusion RIGHT parasagittal ventral pons, difficult to characterize on ADC maps. Old LEFT pontine lacunar infarct. Old bilateral basal ganglia and LEFT greater than RIGHT thalamus infarcts. Susceptibility artifact RIGHT basal ganglia associated with old infarct. Scattered chronic microhemorrhages associated with chronic hypertension. Patchy to confluent supratentorial white matter FLAIR T2 hyperintensities, some of which demonstrate cystic component. No parenchymal brain volume loss for age. Cavum septum pellucidum et vergae, normal variant. No midline  shift, mass effect mass effect or masses. 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 SKULL AND UPPER CERVICAL SPINE: No abnormal sellar expansion. No suspicious calvarial bone marrow signal. Craniocervical junction maintained. SINUSES/ORBITS: The mastoid air-cells and included paranasal sinuses are well-aerated.The included ocular globes and orbital contents are non-suspicious. OTHER: None. IMPRESSION: 1. Motion degraded examination. 2. Acute versus subacute subcentimeter pontine small vessel infarct. 3. 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 4. Moderate chronic small vessel  ischemic changes. Possible component of chronic demyelination though, unlikely. Electronically Signed   By: Awilda Metroourtnay  Bloomer M.D.   On: 06/10/2018 19:08     Results/Tests Pending at Time of Discharge: none  Discharge Medications:  Allergies as of 06/12/2018      Reactions   Lisinopril Cough      Medication List    TAKE these medications   aspirin 81 MG chewable tablet Chew 4 tablets (324 mg total) by mouth daily. Start taking on:  06/13/2018   atorvastatin 80 MG tablet Commonly known as:  LIPITOR Take 1 tablet (80 mg total) by mouth daily at 6 PM.   clopidogrel 75 MG tablet Commonly known as:  PLAVIX Take 1 tablet (75 mg total) by mouth daily. Start taking on:  06/13/2018   losartan-hydrochlorothiazide 100-25 MG tablet Commonly known as:  HYZAAR Take 1 tablet by mouth daily.       Discharge Instructions: Please refer to Patient Instructions section of EMR for full details.  Patient was counseled important signs and symptoms that should prompt return to medical care, changes in medications, dietary instructions, activity restrictions, and follow up appointments.   Follow-Up Appointments: Follow-up Information    [facility]. Schedule an appointment as soon as possible for a visit  in 5 day(s).   Why:  Please contact the [facility] to have follow up for your recent hospital stay.  Contact information: [address] [place] [phone]       [facility] Follow up in 6 week(s).   Why:  Follow-up with neurology 6 weeks after stroke Contact information: [address]     [address] [place] [phone]          Lennox SoldersWinfrey, Laurie Penado C, MD 06/12/2018, 11:32 PM PGY-1, [facility]

## 2018-06-12 NOTE — Progress Notes (Addendum)
Pt has been staying at [facility]. He is without PCP and insurance. CM met with him and encouraged him to use the [facility] for his healthcare and assistance with medication cost. CM called Bevely Palmer Placey at the [facility] and asked for her to watch for him to come to her clinic. Pt would need to be at the [facility] at 8 am and ask for the medical clinic. CM notified him and he voiced understanding.  CM following for medication assistance at d/c and transportation needs.   Addendum: (1630):: Pt discharged back to [facility]. Pt states they have held his bed. CM provided him a [place] letter to cover the cost of his medications. Pt going to pick them up at [facility]. CM also provided him a cab voucher to get from the pharmacy to [facility].

## 2018-06-12 NOTE — Discharge Instructions (Signed)
You were seen and hospitalized due to Hypertensive Emergency. Your blood pressure was so high it began giving you symptoms that can affect your ability to see, walk, talk, and in some cases think and speak. It is imperative that you take the medications recommended to you. Please take them as directed.  Please have follow up and it would be best if you would establish care with a regular primary care physician.

## 2018-06-12 NOTE — Progress Notes (Signed)
PT Cancellation Note  Patient Details Name: Oscar Glenn MRN: [id number] DOB: 06/04/1971   Cancelled Treatment:    Reason Eval/Treat Not Completed: Medical issues which prohibited therapy. On arrival pt's BP 164/104. Will check back as time allows.  Kallie LocksHannah Marlana Mckowen, PTA Pager [phone] Acute Rehab   Sheral ApleyHannah E Shuaib Corsino 06/12/2018, 3:39 PM

## 2018-06-20 ENCOUNTER — Other Ambulatory Visit: Payer: Self-pay

## 2018-06-20 NOTE — Patient Outreach (Signed)
[facility] Care Management  06/20/2018  Marigene EhlersRonald Tellez 1971-11-05 [id number]   Received return call from patient requesting RNCM to call shelter and request that patient not have to go out daily as shelter policy requires due to health reasons.  RNCM explained to patient that a physician would have request this due to patients medical condition.  RNCM informed patient she would call to set up hospital discharge follow up appointment as discussed. Patient verbalized understanding.   George InaDavina Cyniah Gossard RN,BSN,CCM [facility] Telephonic  [phone]

## 2018-06-20 NOTE — Patient Outreach (Signed)
[facility] Care Management  06/20/2018  Marigene EhlersRonald Ludlum 1971-11-24 [id number]  [name]: stroke red alert Referral date: 06/20/18 Referral reason: feeling worse overall: yes Insurance: none Day # 6  Telephone call to patient [name] stroke red alert. HIPAA verified with patient. Explained reason for call.  Patient states he is not feeling any worse just dealing with same symptoms he had. Patient states he is still having some dizziness, blurred vision, fatigue and dragging his left side.  Patient states he does not have an appointment with a primary MD. Patient states he is aware he has an appointment with the neurologist on 07/21/18.  Patient states he is unsure about having therapy. He reports her is currently living in a shelter. Patient states," I think the lady in the hospital was helping me to apply for Medicaid and disability." Patient states he is scheduled to meet with a person at the shelter to assist him with housing.  RNCM offered to call and arrange primary MD appointment for patient. Patient verbalized appreciation.  RNCM offered [facility] care [facility] services with social worker to follow up regarding Medicaid.  Patient verbally agreed.  [facility] contacted [facility] to schedule primary MD appointment.  RNCM spoke with Montel ClockKierra who states RNCM will have to call back on Monday 06/23/18 when appointments are open.  RNCM informed patient that appointment will have to made on 06/23/18.   RNCM reviewed signs/ symptoms of stroke. Advised to call 911 for stroke like symptoms. Patient reports he has his medication and is taking as prescribed.  RNCM reviewed medication with patient.  Patient states he is taking a blood thinner.  RNCM discussed signs/ symptoms of bleeding with patient. Advised to notify doctor for symptoms. Patient verbalized understanding.  RNCM verified patient aware of 911 services for urgent/ emergent needs.   PLAN: RNCM will follow up with patient within 3  business days.  RNCM will refer patient to social worker  RNCM will attempt to call primary MD office and arrange post hospital follow up visit for patient.   George InaDavina Haadi Santellan RN,BSN,CCM [facility] Telephonic  [phone]

## 2018-06-23 ENCOUNTER — Other Ambulatory Visit: Payer: Self-pay

## 2018-06-23 NOTE — Patient Outreach (Addendum)
[facility]N) Care Management  06/23/2018  Marigene EhlersRonald Teale 12-Aug-1971 [id number]  [name] stroke follow up/  Care coordination:  [facility] post [facility] discharge appointment for patient.  Contact made with [place].  Hospital discharge appointment scheduled for July 02, 2018 at 9: 50am.   Contacted patient to inform him of scheduled appointment for July 02, 2018 at 9:50am.  Patient states he left the shelter and is not in the area but will return for the July 02, 2018 appointment. RNCM advised patient of importance of keeping appointment. Advised patient to continue to take his medications as prescribed. Reviewed signs symptoms of stroke with patient. Advised to call 911 for stroke like symptoms.  RNCM offered to send patient [name] education material related to stroke and stroke recovery. Patient reports he does not have a mailing address.   PLAN: RNCM will follow up with patient within 4 business days   George InaDavina Navy Rothschild RN,BSN,CCM [facility] Telephonic  [phone]

## 2018-06-23 NOTE — Patient Outreach (Signed)
[facility]) Care Management  06/23/2018  Oscar Glenn 11-04-1971 [id number]  [name] Stroke Patient:  BSW received referral to assist patient in [employer] application. Upon chart review it is noted that inpatient social worker referred patient to a financial counselor on 6/12. BSW contacted the financial counselor assigned to patient and left a voice message requesting a return call to update this BSW on status of Medicaid application. BSW will plan to contact patient within the next business day.  Bevelyn NgoKendra Cierah Crader, BSW, CDP [facility] Social Worker [phone]

## 2018-06-23 NOTE — Patient Outreach (Signed)
[facility]) Care Management  06/23/2018  Oscar EhlersRonald Glenn 02/28/1971 [id number]   Successful outreach to the patient. HIPAA identifiers confirmed. Patient believes the Medicaid application was submitted while the patient was admitted to the hospital but does not recall assisting with the completion of an application. BSW notified patient that this BSW has left a voice message for the financial counselor to inquire the status of patients application. Patient also stated he would like to "start the disability process". BSW spoke with patient about where to go [place] to [place] disability. Patient stated "I will be there on the 3rd for a doctors appointment so I will do that then too". Patient states [place] currently in [place].  BSW informed patient that if he has access to a computer he can apply for Medicaid online. Patient denies access. BSW encouraged patient to visit a local library in order to start the Medicaid process. BSW to outreach patient in the next 4 days to update what this BSW hears from the inpatient financial counselor.  Bevelyn NgoKendra Moses Odoherty, BSW, CDP [facility] Care [facility] Social Worker [phone]

## 2018-06-26 ENCOUNTER — Other Ambulatory Visit: Payer: Self-pay

## 2018-06-26 ENCOUNTER — Ambulatory Visit: Payer: Self-pay

## 2018-06-26 NOTE — Patient Outreach (Signed)
[facility]) Care Management  06/26/2018  Oscar EhlersRonald Glenn Aug 17, 1971 [id number]  [name] Stroke Patient:  BSW placed an unsuccessful call to the patient on today's date to discuss status of Medicaid application. Patient returned this BSW's call, patient able to verify HIPAA identifiers. BSW explained to the patient that the financial counselor did not assist the patient with a Medicaid application but instead conducted a "screening". BSW also informed the patient that the counselor stated she was "monitoring for follow up visits to see if he would meet disability guidelines".   BSW offered to provide the patient with the financial counselors contact information as his point of contact about Medicaid eligibility. Patient became angry stating "monitoring me for what? I don't have a job or money, I don't have time for this bull shit". BSW apologized to the patient and explained that Medicaid has certain requirements that may make you eligible for service. BSW explained to the patient that if he preferred he could initiate his own Medicaid application at a local DSS. Patient stated "I just need it to get my medicine. I don't even care if I take that medicine they just tell me to".   BSW again apologized to the patient explaining that this BSW does not work for DSS and is just trying to assist. BSW offered to provide the patient with the financial counselors name and number in order for the patient to discuss what requirements he would need to meet. Patient requested this BSW text him the information. BSW explained to the patient that this BSW would be unable to text the patient but would be happy to give him the number verbally. Patient stated "just forget it, I don't have time for this shit". Patient then hung up the phone.   BSW will resolve social work episode and update the [facility] RNCM who is following the patient for [name] calls.  Bevelyn NgoKendra Pari Lombard, BSW, CDP [facility] Care  Management Social Worker [phone]

## 2018-06-26 NOTE — Patient Outreach (Signed)
[facility]) Care Management  06/26/2018  Oscar EhlersRonald Glenn [birth year] [id number]   [name]: stroke red alert Referral date: 06/20/18 Referral reason: feeling worse overall: yes Insurance: none Day # 6  Telephone call to patient regarding [name] stroke follow up. Unable to reach patient. HIPAA compliant voice message left with call back phone number.  Received update  from Enrique SackKendra, Child psychotherapistsocial worker with [facility] care [facility], that [facility].   PLAN:  RNCM will attempt 2nd telephone call to patient within 4 business days. Unable to send outreach letter due to no mailing address.   George InaDavina Ammaar Encina RN,BSN,CCM [facility] Telephonic  [phone]

## 2018-06-27 ENCOUNTER — Other Ambulatory Visit: Payer: Self-pay

## 2018-06-27 NOTE — Patient Outreach (Signed)
[facility]) Care Management  06/27/2018  Marigene EhlersRonald Biswell [birth year] [id number]  [name]:stroke red alert Referral date:06/20/18 Referral reason:feeling worse overall: yes Insurance: none Day #6 Attempt #2   Telephone call to patient regarding [name] stroke follow up. Unable to reach patient. HIPAA compliant voice message left with call back phone number.  Received update  from Enrique SackKendra, Child psychotherapistsocial worker with [facility] care [facility], that [facility].   PLAN:  RNCM will attempt 3rd  telephone call to patient within 4 business days.   George InaDavina Noell Lorensen RN,BSN,CCM [facility] Telephonic  [phone]

## 2018-06-30 ENCOUNTER — Ambulatory Visit: Payer: Self-pay

## 2018-07-02 ENCOUNTER — Other Ambulatory Visit: Payer: Self-pay

## 2018-07-02 ENCOUNTER — Ambulatory Visit: Payer: Self-pay

## 2018-07-02 ENCOUNTER — Inpatient Hospital Stay: Payer: Self-pay

## 2018-07-02 NOTE — Progress Notes (Deleted)
Patient ID: Oscar EhlersRonald Glenn, male   DOB: 05-May-1971, 47 y.o.   MRN: [id number]   After hospitalization 6/11-6/13/2019 for L sided weakness and CVA.    Mr. Oscar Glenn was admitted on 06/10/18 for left sided weakness.  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  CTA head and neck was significant for severe stenosis of the bilateral PCA with moderate stenosis of the ACA and MCA, consistent with atherosclerosis.  LDL was 154, although his U1LA1c was normal.  He was also found to have hypertensive urgency with BP 190s/100s on presentation.  Patient's symptoms were thought to be due to nonadherence to previously prescribed hypertension and cholesterol medications.  Further stroke workup was negative, with no embolic source found on echo and carotid arteries with 1-39% stenosis on doppler.    Patient was prescribed ASA, Plavix, and Lipitor 80 mg for further stroke prevention and losartan-HCTZ for blood pressure control.  Since patient lives in a homeless shelter and does not have insurance, social work was consulted to help patient obtain outpatient medications.  [facility] discharge and [facility].  He was also referred for outpatient physical therapy to improve his weakness.   Issues for Follow Up:  1. Closely monitor patient's blood pressure and adjust hypertension medication as necessary.  He may benefit from addition of amlodipine if BP is still uncontrolled. 2. Please ensure that patient is able to obtain his medications and go to physical therapy outpatient given his poor social situation.  Patient had mentioned that he does not want to continue living in the shelter and he has a job, so any resources to help him find a better home situation would be greatly beneficial.

## 2018-07-02 NOTE — Patient Outreach (Signed)
[facility]) Care Management  07/02/2018  Marigene EhlersRonald Vandervelden 1971/11/07 [id number]  [name]:stroke red alert Referral date:06/20/18 Referral reason:feeling worse overall: yes Insurance: none Day #6 Attempt #3   Telephone call to patient regarding [name] stroke follow up. Unable to reach patient. HIPAA compliant voice message left with call back phone number.   PLAN: RNCM will close patient due to being unable to reach  George InaDavina Lamarkus Nebel RN,BSN,CCM [facility] Telephonic  [phone]

## 2018-07-21 ENCOUNTER — Ambulatory Visit: Payer: Self-pay | Admitting: Neurology

## 2020-04-16 IMAGING — CT CT HEAD W/O CM
4 series · 15 of 47 positions shown, 17 images · non-contrast
Comparison: None.

CLINICAL DATA: Blurred vision with dizziness and slurred speech

EXAM:
CT HEAD WITHOUT CONTRAST
TECHNIQUE: Contiguous axial images were obtained from the base of the skull
through the vertex without intravenous contrast.

[Series 3: head without · axial · non-contrast · 0.45mm/px · z∈[-528,-413]mm · 7 of 31 slices shown, 9 images]
[im 4/31  brain]
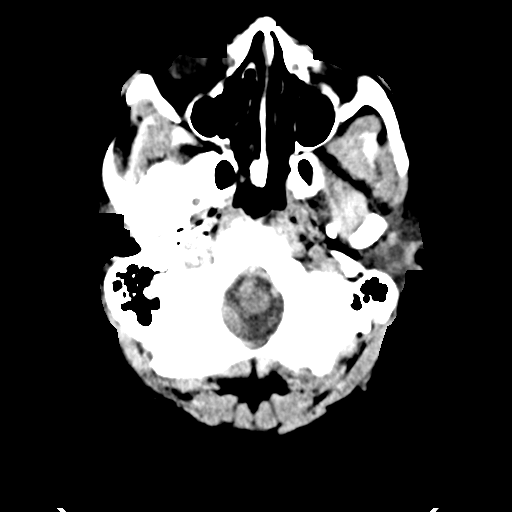
[im 4/31  bone]
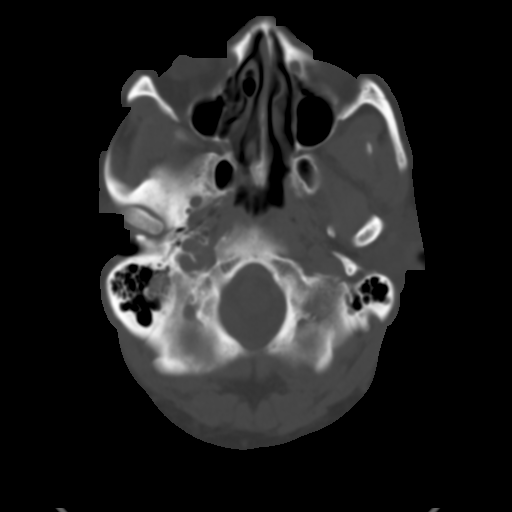
[im 8/31  brain]
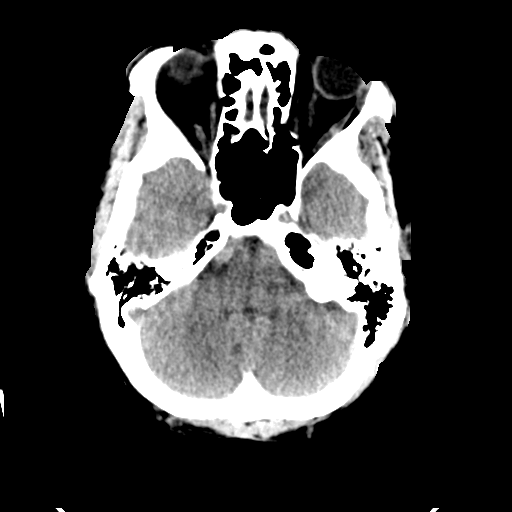
[im 12/31  brain]
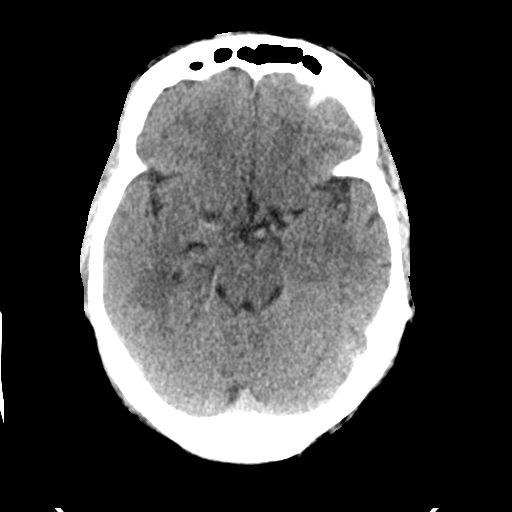
[im 16/31  brain]
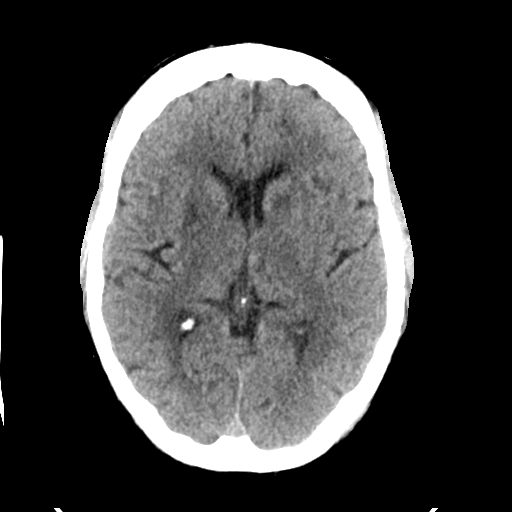
[im 19/31  brain]
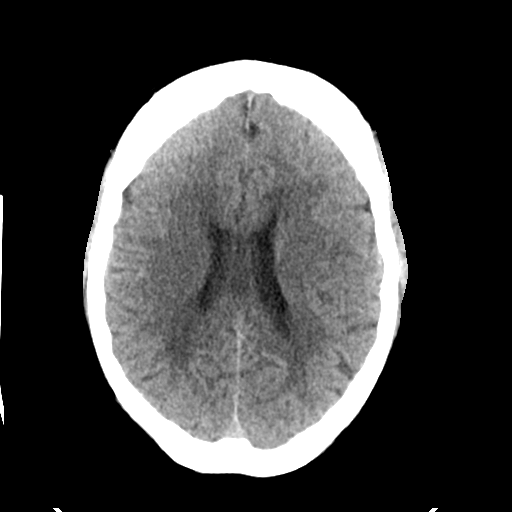
[im 19/31  bone]
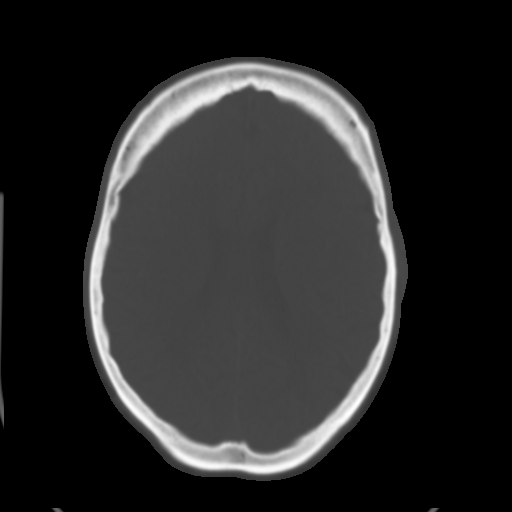
[im 23/31  brain]
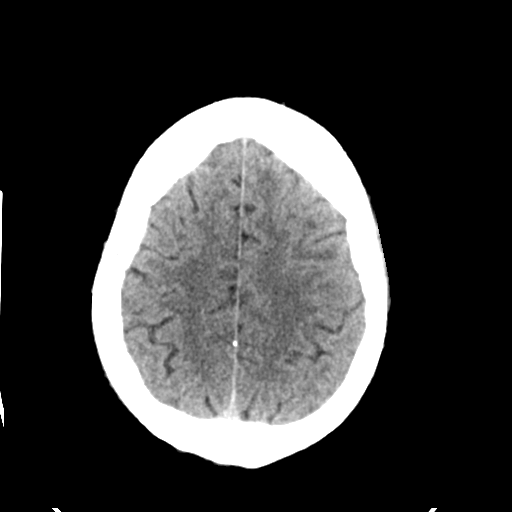
[im 27/31  brain]
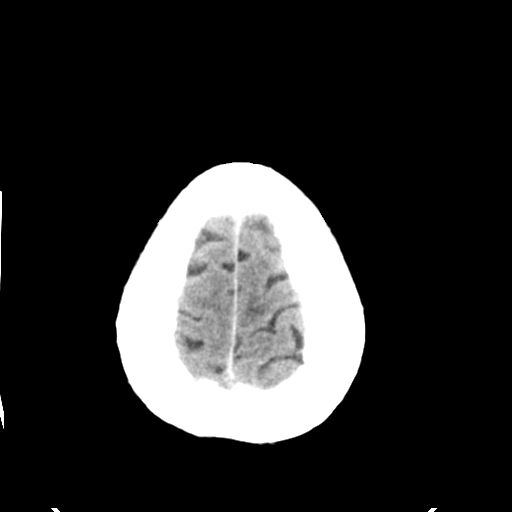

[Series 4: head bone · axial · 0.45mm/px · z∈[-529,-513]mm · 2 of 76 slices shown]
[im 8/76  bone]
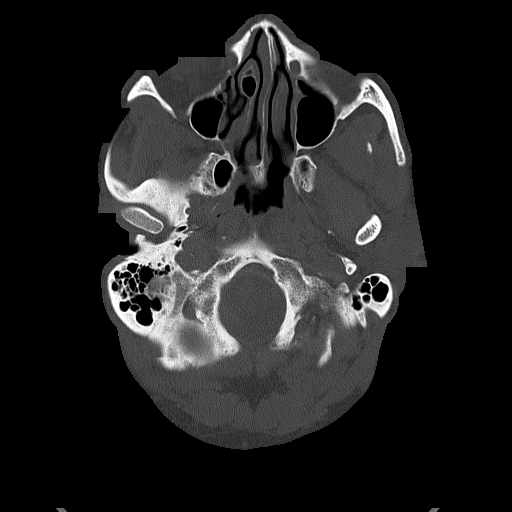
[im 16/76  bone]
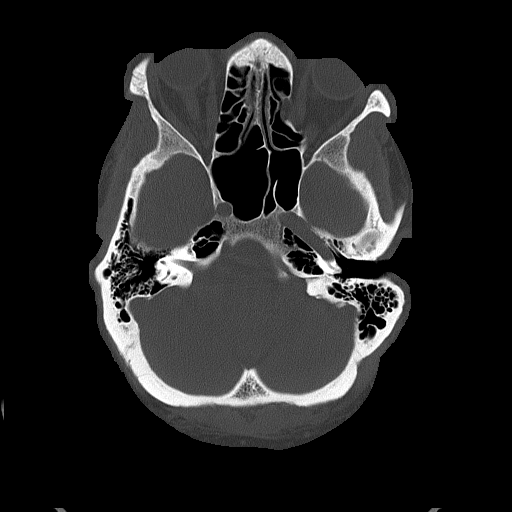

[Series 5: head without cor · coronal · non-contrast · 0.30mm/px · 3 of 68 slices shown]
[im 23/68  brain]
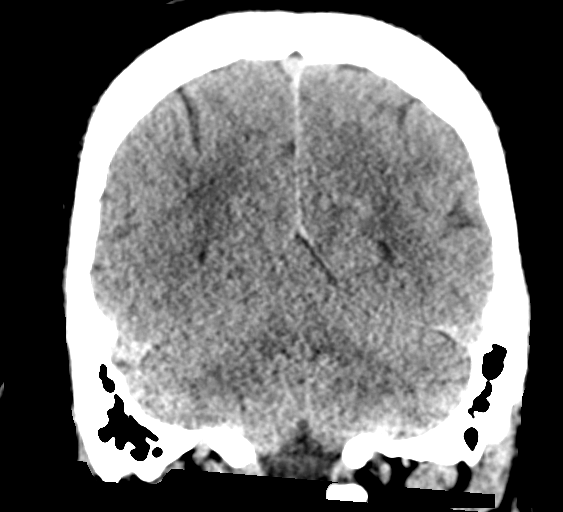
[im 30/68  brain]
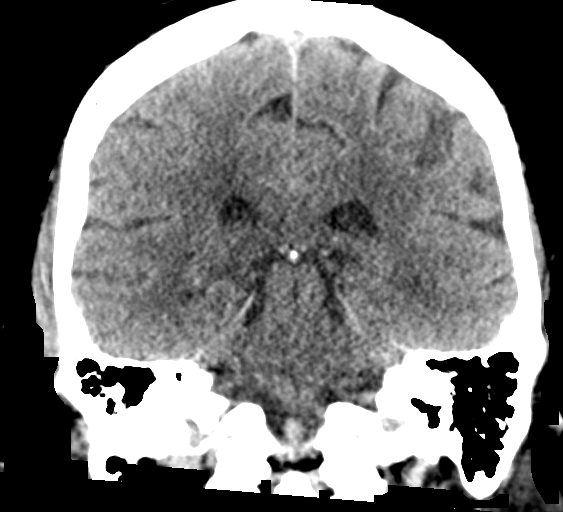
[im 38/68  brain]
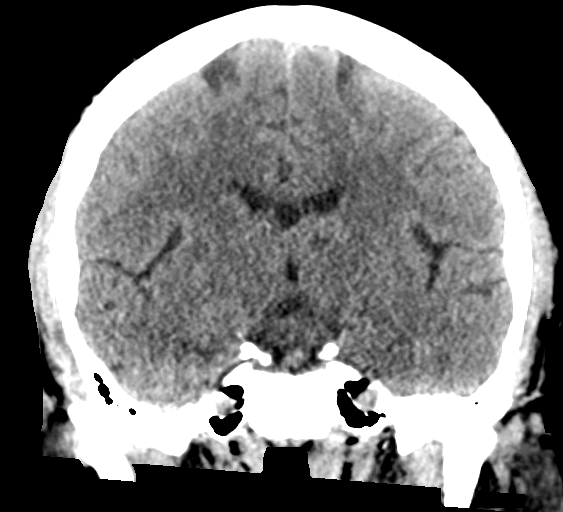

[Series 6: head without sag · sagittal · non-contrast · 0.30mm/px · 3 of 54 slices shown]
[im 18/54  brain]
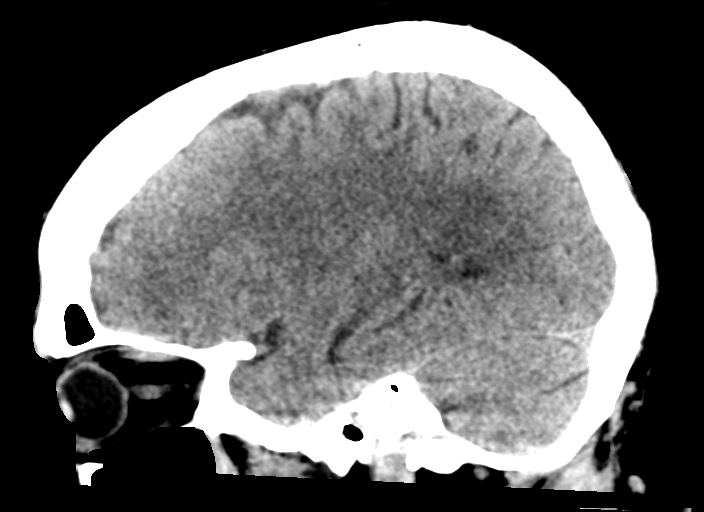
[im 27/54  brain]
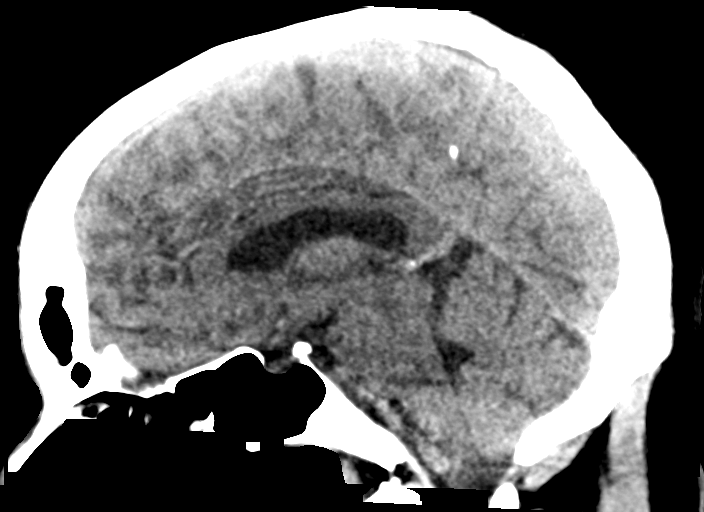
[im 36/54  brain]
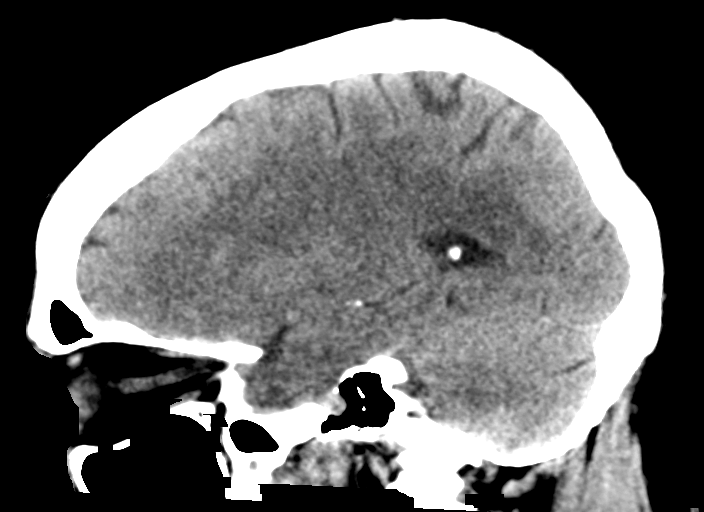

[15 of 47 positions shown; findings below may reference images not displayed]

FINDINGS: Brain: The ventricles are normal in size and configuration. There is
a cavum septum pellucidum, an anatomic variant. There is no
intracranial mass, hemorrhage, extra-axial fluid collection, or
midline shift. There is a prior small infarct in the anterior right
lentiform nucleus. There is small vessel disease with several small
lacunar infarcts in the centra semiovale bilaterally. There are
small lacunar type infarcts in the left thalamus. There is a prior
lacunar infarct in the left mid pons region anteriorly.

There is decreased attenuation in the region of the anterior limb of
the left internal capsule which is somewhat ill-defined and may
represent a recent/acute infarct.

Vascular: No hyperdense vessel evident. There is mild calcification
in each cavernous carotid artery region.

Skull: Bony calvarium appears intact.

Sinuses/Orbits: There is mucosal thickening in several ethmoid air
cells bilaterally. Other visualized paranasal sinuses are clear.
There is leftward deviation of the anterior nasal septum. Orbits
appear symmetric bilaterally.

Other: Mastoid air cells are clear.
IMPRESSION: Atrophy with supratentorial small vessel disease. Scattered small
infarcts are noted at several sites. There is ill-defined decreased
attenuation in the anterior limb of the left internal capsule.
Question recent and possibly acute infarct in this area. Other
apparent infarcts are felt to most likely be older based on their
overall appearance.

No hemorrhage or mass. There are foci of arterial vascular
calcification in each distal carotid artery.

There is mucosal thickening in several ethmoid air cells. There is
leftward deviation of the anterior nasal septum.
